# Patient Record
Sex: Female | Born: 1945 | Hispanic: No | Smoking: Never smoker
Health system: Southern US, Community
[De-identification: ages and names within clinical notes are randomized; demographics above are authoritative.]

## PROBLEM LIST (undated history)

## (undated) DIAGNOSIS — K759 Inflammatory liver disease, unspecified: Secondary | ICD-10-CM

## (undated) DIAGNOSIS — R040 Epistaxis: Secondary | ICD-10-CM

## (undated) DIAGNOSIS — I10 Essential (primary) hypertension: Secondary | ICD-10-CM

## (undated) DIAGNOSIS — M1990 Unspecified osteoarthritis, unspecified site: Secondary | ICD-10-CM

## (undated) HISTORY — PX: TOTAL KNEE ARTHROPLASTY: SHX125

## (undated) HISTORY — DX: Epistaxis: R04.0

## (undated) HISTORY — DX: Essential (primary) hypertension: I10

---

## 2013-09-28 ENCOUNTER — Ambulatory Visit (INDEPENDENT_AMBULATORY_CARE_PROVIDER_SITE_OTHER): Payer: Medicare Other

## 2013-09-28 VITALS — BP 146/93 | HR 88 | Resp 18

## 2013-09-28 DIAGNOSIS — G608 Other hereditary and idiopathic neuropathies: Secondary | ICD-10-CM

## 2013-09-28 DIAGNOSIS — G609 Hereditary and idiopathic neuropathy, unspecified: Secondary | ICD-10-CM

## 2013-09-28 DIAGNOSIS — G629 Polyneuropathy, unspecified: Principal | ICD-10-CM

## 2013-09-28 NOTE — Patient Instructions (Signed)

## 2013-09-28 NOTE — Progress Notes (Signed)
   Subjective:    Patient ID: Joy Palmer, female    DOB: [birth year], 68 y.o.   MRN: [id number]  HPI my feet are numb and have been that way for about 9 years ago and was in the right foot only and has gradually getting worse and left is not as bad and the shoes have helped    Review of Systems  HENT:       Ringing in ears  All other systems reviewed and are negative.      Objective:   Physical Exam 68 year old female presents this time well-developed well-nourished oriented x3 he continues to have complaints of over 9 years of abnormal sensation in the feet previously was in the right foot has gradually worsening extending from the foot to the ankle and lower leg this was previously seen did not have the same symptoms and cramping at times in the balls of the feet was previously wearing shoes that were too tight and narrow and possibly had suspect neuroma symptomology that portion has resolved no longer having the cramping sensation in the toes no longer tingling extending to the toes however patient continues to have numbness which is progressing from her toes to feet and lower leg. Patient had had some vascular testing at this time her lower extremity objective findings reveal palpable pedal pulses DP +2/4 PT one over 4 bilateral mild varicosities are noted skin temperature is warm to cool turgor somewhat diminished there is no edema rubor noted neurologically epicritic and proprioceptive sensations appear to be somewhat diminished on [facility] patient describes abnormal sensation or loss of sensation to her feet and ankles and lower leg left more so than right DTRs not elicited there is normal plantar response orthopedic exam unremarkable rectus foot type mild digital contractures no reducible pain or symptomology on compression of the interspaces no neuroma type symptomology cramping noted at this time.       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 Literature on neuropathy is given to the patient for her to review and recommendations times [facility] for workup of lower extremity peripheral neuropathy/sensory neuropathy patient seems somewhat reluctant about seeing a neurologist wanted to know if kinesiologist or chiropractor would be helpful however I suggest that without knowing a diagnosis starting a treatment program would be ill-advised patient was advised that she needs to have a thorough evaluation to discover the cause or aggravating factors causing the progressive nature of her sensory changes. [facility] carried out at this time  Joy Palmer DPM

## 2014-11-02 DIAGNOSIS — M1711 Unilateral primary osteoarthritis, right knee: Secondary | ICD-10-CM | POA: Diagnosis not present

## 2014-11-02 DIAGNOSIS — M25561 Pain in right knee: Secondary | ICD-10-CM | POA: Diagnosis not present

## 2015-02-16 DIAGNOSIS — Z01818 Encounter for other preprocedural examination: Secondary | ICD-10-CM | POA: Diagnosis not present

## 2015-02-21 ENCOUNTER — Other Ambulatory Visit: Payer: Self-pay | Admitting: Orthopedic Surgery

## 2015-03-16 DIAGNOSIS — M1712 Unilateral primary osteoarthritis, left knee: Secondary | ICD-10-CM | POA: Diagnosis not present

## 2015-03-19 ENCOUNTER — Encounter (HOSPITAL_COMMUNITY)
Admission: RE | Admit: 2015-03-19 | Discharge: 2015-03-19 | Disposition: A | Discharge status: Home / Self Care | Payer: Medicare Other | Source: Ambulatory Visit | Attending: Orthopedic Surgery | Admitting: Orthopedic Surgery

## 2015-03-19 ENCOUNTER — Encounter (HOSPITAL_COMMUNITY): Payer: Self-pay

## 2015-03-19 DIAGNOSIS — J984 Other disorders of lung: Secondary | ICD-10-CM | POA: Insufficient documentation

## 2015-03-19 DIAGNOSIS — M1711 Unilateral primary osteoarthritis, right knee: Secondary | ICD-10-CM | POA: Diagnosis not present

## 2015-03-19 DIAGNOSIS — Z01812 Encounter for preprocedural laboratory examination: Secondary | ICD-10-CM | POA: Insufficient documentation

## 2015-03-19 DIAGNOSIS — Z01818 Encounter for other preprocedural examination: Secondary | ICD-10-CM | POA: Diagnosis not present

## 2015-03-19 HISTORY — DX: Unspecified osteoarthritis, unspecified site: M19.90

## 2015-03-19 HISTORY — DX: Inflammatory liver disease, unspecified: K75.9

## 2015-03-19 LAB — PROTIME-INR
INR: 1.01 (ref 0.00–1.49)
PROTHROMBIN TIME: 13.5 s (ref 11.6–15.2)

## 2015-03-19 LAB — APTT: APTT: 35 s (ref 24–37)

## 2015-03-19 LAB — CBC WITH DIFFERENTIAL/PLATELET
BASOS ABS: 0.1 10*3/uL (ref 0.0–0.1)
Basophils Relative: 1 %
EOS ABS: 0.1 10*3/uL (ref 0.0–0.7)
EOS PCT: 2 %
HCT: 44.1 % (ref 36.0–46.0)
Hemoglobin: 13.9 g/dL (ref 12.0–15.0)
LYMPHS PCT: 18 %
Lymphs Abs: 1.6 10*3/uL (ref 0.7–4.0)
MCH: 29.8 pg (ref 26.0–34.0)
MCHC: 31.5 g/dL (ref 30.0–36.0)
MCV: 94.6 fL (ref 78.0–100.0)
Monocytes Absolute: 0.7 10*3/uL (ref 0.1–1.0)
Monocytes Relative: 8 %
Neutro Abs: 6.5 10*3/uL (ref 1.7–7.7)
Neutrophils Relative %: 71 %
Platelets: 178 10*3/uL (ref 150–400)
RBC: 4.66 MIL/uL (ref 3.87–5.11)
RDW: 15.9 % — ABNORMAL HIGH (ref 11.5–15.5)
WBC: 9 10*3/uL (ref 4.0–10.5)

## 2015-03-19 LAB — URINALYSIS, ROUTINE W REFLEX MICROSCOPIC
BILIRUBIN URINE: NEGATIVE
Glucose, UA: NEGATIVE mg/dL
Hgb urine dipstick: NEGATIVE
Ketones, ur: NEGATIVE mg/dL
Leukocytes, UA: NEGATIVE
NITRITE: NEGATIVE
PH: 6.5 (ref 5.0–8.0)
Protein, ur: NEGATIVE mg/dL
SPECIFIC GRAVITY, URINE: 1.021 (ref 1.005–1.030)
UROBILINOGEN UA: 1 mg/dL (ref 0.0–1.0)

## 2015-03-19 LAB — COMPREHENSIVE METABOLIC PANEL
ALBUMIN: 4.1 g/dL (ref 3.5–5.0)
ALT: 28 U/L (ref 14–54)
AST: 25 U/L (ref 15–41)
Alkaline Phosphatase: 67 U/L (ref 38–126)
Anion gap: 9 (ref 5–15)
BILIRUBIN TOTAL: 0.7 mg/dL (ref 0.3–1.2)
BUN: 26 mg/dL — AB (ref 6–20)
CHLORIDE: 103 mmol/L (ref 101–111)
CO2: 28 mmol/L (ref 22–32)
CREATININE: 0.78 mg/dL (ref 0.44–1.00)
Calcium: 9.6 mg/dL (ref 8.9–10.3)
GFR calc Af Amer: >60 mL/min (ref >60)
GLUCOSE: 87 mg/dL (ref 65–99)
POTASSIUM: 3.9 mmol/L (ref 3.5–5.1)
Sodium: 140 mmol/L (ref 135–145)
TOTAL PROTEIN: 6.8 g/dL (ref 6.5–8.1)

## 2015-03-19 LAB — SURGICAL PCR SCREEN
MRSA, PCR: NEGATIVE
STAPHYLOCOCCUS AUREUS: NEGATIVE

## 2015-03-19 NOTE — Pre-Procedure Instructions (Signed)
    Joy LootsMargaret S Palmer  03/19/2015      [facility] [place] [address] [place] [address] Phone: [phone] Fax: [phone]    Your procedure is scheduled on 03-25-2016  Monday  .  [facility] Admitting at 5:30 A.M.   Call this number if you have problems the morning of surgery:  [phone]   Remember:  Do not eat food or drink liquids after midnight.   Take these medicines the morning of surgery with A SIP OF WATER none   Do not wear jewelry, make-up or nail polish.   Do not wear lotions, powders, or perfumes.  You may not wear deodorant.  Do not shave 48 hours prior to surgery.     Do not bring valuables to the hospital.  [facility] is not responsible for any belongings or valuables.  Contacts, dentures or bridgework may not be worn into surgery.  Leave your suitcase in the car.  After surgery it may be brought to your room.  For patients admitted to the hospital, discharge time will be determined by your treatment team.     Special instructions:  See attached sheet "Preparing for surgery" for instructions on CHG shower  Please read over the following fact sheets that you were given. Pain Booklet, Coughing and Deep Breathing and Surgical Site Infection Prevention

## 2015-03-19 NOTE — Progress Notes (Signed)
[facility] and [facility] [place].  Pt. Denies any type of cardiac testing.

## 2015-03-20 LAB — URINE CULTURE

## 2015-03-23 MED ORDER — CHLORHEXIDINE GLUCONATE 4 % EX LIQD: 60.0000 mL | Freq: Once | CUTANEOUS | Status: DC

## 2015-03-23 MED ORDER — CEFAZOLIN SODIUM-DEXTROSE 2-3 GM-% IV SOLR
2.0000 g | INTRAVENOUS | Status: AC
  Administered 2015-03-26: 2 g via INTRAVENOUS
  Filled 2015-03-23: qty 50

## 2015-03-23 MED ORDER — SODIUM CHLORIDE 0.9 % IV SOLN
1000.0000 mg | INTRAVENOUS | Status: AC
  Administered 2015-03-26: 1000 mg via INTRAVENOUS
  Filled 2015-03-23: qty 10

## 2015-03-23 MED ORDER — BUPIVACAINE LIPOSOME 1.3 % IJ SUSP
20.0000 mL | Freq: Once | INTRAMUSCULAR | Status: AC
  Administered 2015-03-26: 20 mL
  Filled 2015-03-23: qty 20

## 2015-03-23 MED ORDER — SODIUM CHLORIDE 0.9 % IV SOLN: INTRAVENOUS | Status: DC

## 2015-03-26 ENCOUNTER — Inpatient Hospital Stay (HOSPITAL_COMMUNITY)
Admission: AD | Admit: 2015-03-26 | Discharge: 2015-03-27 | DRG: 470 | Disposition: A | Discharge status: Home Health | Payer: Medicare Other | Source: Ambulatory Visit | Attending: Orthopedic Surgery | Admitting: Orthopedic Surgery

## 2015-03-26 ENCOUNTER — Encounter (HOSPITAL_COMMUNITY): Payer: Self-pay | Admitting: *Deleted

## 2015-03-26 ENCOUNTER — Inpatient Hospital Stay (HOSPITAL_COMMUNITY): Payer: Medicare Other | Admitting: Certified Registered Nurse Anesthetist

## 2015-03-26 ENCOUNTER — Encounter (HOSPITAL_COMMUNITY)
Admission: AD | Disposition: A | Discharge status: Home Health | Payer: Self-pay | Source: Ambulatory Visit | Attending: Orthopedic Surgery

## 2015-03-26 DIAGNOSIS — D62 Acute posthemorrhagic anemia: Secondary | ICD-10-CM | POA: Diagnosis not present

## 2015-03-26 DIAGNOSIS — I10 Essential (primary) hypertension: Secondary | ICD-10-CM | POA: Diagnosis not present

## 2015-03-26 DIAGNOSIS — M1711 Unilateral primary osteoarthritis, right knee: Principal | ICD-10-CM | POA: Diagnosis present

## 2015-03-26 DIAGNOSIS — M179 Osteoarthritis of knee, unspecified: Secondary | ICD-10-CM | POA: Diagnosis not present

## 2015-03-26 DIAGNOSIS — M25561 Pain in right knee: Secondary | ICD-10-CM | POA: Diagnosis present

## 2015-03-26 DIAGNOSIS — Z96659 Presence of unspecified artificial knee joint: Secondary | ICD-10-CM

## 2015-03-26 HISTORY — PX: TOTAL KNEE ARTHROPLASTY: SHX125

## 2015-03-26 LAB — CBC
HEMATOCRIT: 42.4 % (ref 36.0–46.0)
HEMOGLOBIN: 12.9 g/dL (ref 12.0–15.0)
MCH: 29.1 pg (ref 26.0–34.0)
MCHC: 30.4 g/dL (ref 30.0–36.0)
MCV: 95.5 fL (ref 78.0–100.0)
Platelets: 187 10*3/uL (ref 150–400)
RBC: 4.44 MIL/uL (ref 3.87–5.11)
RDW: 16.1 % — AB (ref 11.5–15.5)
WBC: 20.6 10*3/uL — ABNORMAL HIGH (ref 4.0–10.5)

## 2015-03-26 LAB — CREATININE, SERUM
CREATININE: 0.88 mg/dL (ref 0.44–1.00)
GFR calc Af Amer: >60 mL/min (ref >60)
GFR calc non Af Amer: >60 mL/min (ref >60)

## 2015-03-26 SURGERY — ARTHROPLASTY, KNEE, TOTAL
Anesthesia: Spinal | Site: Knee | Laterality: Right

## 2015-03-26 MED ORDER — BISACODYL 5 MG PO TBEC: 5.0000 mg | DELAYED_RELEASE_TABLET | Freq: Every day | ORAL | Status: DC | PRN

## 2015-03-26 MED ORDER — BUPIVACAINE IN DEXTROSE 0.75-8.25 % IT SOLN
INTRATHECAL | Status: DC | PRN
  Administered 2015-03-26: 1.8 mL via INTRATHECAL

## 2015-03-26 MED ORDER — SODIUM CHLORIDE 0.9 % IJ SOLN
INTRAMUSCULAR | Status: DC | PRN
  Administered 2015-03-26: 20 mL

## 2015-03-26 MED ORDER — OXYCODONE HCL 5 MG PO TABS
5.0000 mg | ORAL_TABLET | Freq: Once | ORAL | Status: AC | PRN
  Administered 2015-03-26: 5 mg via ORAL

## 2015-03-26 MED ORDER — LISINOPRIL-HYDROCHLOROTHIAZIDE 20-12.5 MG PO TABS: 1.0000 | ORAL_TABLET | Freq: Every day | ORAL | Status: DC

## 2015-03-26 MED ORDER — METHOCARBAMOL 500 MG PO TABS
ORAL_TABLET | ORAL | Status: AC
  Filled 2015-03-26: qty 1

## 2015-03-26 MED ORDER — ONDANSETRON HCL 4 MG/2ML IJ SOLN
INTRAMUSCULAR | Status: AC
  Filled 2015-03-26: qty 2

## 2015-03-26 MED ORDER — ONDANSETRON HCL 4 MG/2ML IJ SOLN
INTRAMUSCULAR | Status: DC | PRN
  Administered 2015-03-26: 4 mg via INTRAVENOUS

## 2015-03-26 MED ORDER — SODIUM CHLORIDE 0.9 % IV SOLN
INTRAVENOUS | Status: DC
  Administered 2015-03-26: 20:00:00 via INTRAVENOUS

## 2015-03-26 MED ORDER — ACETAMINOPHEN 325 MG PO TABS: 650.0000 mg | ORAL_TABLET | Freq: Four times a day (QID) | ORAL | Status: DC | PRN

## 2015-03-26 MED ORDER — METOPROLOL TARTRATE 1 MG/ML IV SOLN
INTRAVENOUS | Status: AC
  Filled 2015-03-26: qty 5

## 2015-03-26 MED ORDER — HYDROCHLOROTHIAZIDE 25 MG PO TABS
25.0000 mg | ORAL_TABLET | Freq: Every day | ORAL | Status: DC
  Administered 2015-03-26 – 2015-03-27 (×2): 25 mg via ORAL
  Filled 2015-03-26 (×2): qty 1

## 2015-03-26 MED ORDER — ONDANSETRON HCL 4 MG PO TABS
4.0000 mg | ORAL_TABLET | Freq: Four times a day (QID) | ORAL | Status: DC | PRN
  Filled 2015-03-26: qty 1

## 2015-03-26 MED ORDER — BUPIVACAINE-EPINEPHRINE (PF) 0.25% -1:200000 IJ SOLN
INTRAMUSCULAR | Status: AC
  Filled 2015-03-26: qty 30

## 2015-03-26 MED ORDER — MENTHOL 3 MG MT LOZG: 1.0000 | LOZENGE | OROMUCOSAL | Status: DC | PRN

## 2015-03-26 MED ORDER — LISINOPRIL 20 MG PO TABS
20.0000 mg | ORAL_TABLET | Freq: Every day | ORAL | Status: DC
  Administered 2015-03-26 – 2015-03-27 (×2): 20 mg via ORAL
  Filled 2015-03-26 (×2): qty 1

## 2015-03-26 MED ORDER — OXYCODONE HCL 5 MG PO TABS
ORAL_TABLET | ORAL | Status: AC
  Filled 2015-03-26: qty 1

## 2015-03-26 MED ORDER — SENNOSIDES-DOCUSATE SODIUM 8.6-50 MG PO TABS: 1.0000 | ORAL_TABLET | Freq: Every evening | ORAL | Status: DC | PRN

## 2015-03-26 MED ORDER — ACETAMINOPHEN 650 MG RE SUPP: 650.0000 mg | Freq: Four times a day (QID) | RECTAL | Status: DC | PRN

## 2015-03-26 MED ORDER — HYDROMORPHONE HCL 1 MG/ML IJ SOLN
1.0000 mg | INTRAMUSCULAR | Status: DC | PRN
  Administered 2015-03-26 – 2015-03-27 (×3): 1 mg via INTRAVENOUS
  Filled 2015-03-26 (×3): qty 1

## 2015-03-26 MED ORDER — ALUM & MAG HYDROXIDE-SIMETH 200-200-20 MG/5ML PO SUSP: 30.0000 mL | ORAL | Status: DC | PRN

## 2015-03-26 MED ORDER — OXYCODONE HCL 5 MG PO TABS
5.0000 mg | ORAL_TABLET | ORAL | Status: DC | PRN
  Administered 2015-03-27 (×2): 10 mg via ORAL
  Filled 2015-03-26 (×2): qty 2

## 2015-03-26 MED ORDER — MIDAZOLAM HCL 2 MG/2ML IJ SOLN
INTRAMUSCULAR | Status: DC | PRN
  Administered 2015-03-26: 0.5 mg via INTRAVENOUS
  Administered 2015-03-26: 1.5 mg via INTRAVENOUS

## 2015-03-26 MED ORDER — PROPOFOL 10 MG/ML IV BOLUS
INTRAVENOUS | Status: DC | PRN
  Administered 2015-03-26 (×7): 25 mg via INTRAVENOUS

## 2015-03-26 MED ORDER — ENOXAPARIN SODIUM 30 MG/0.3ML ~~LOC~~ SOLN
30.0000 mg | Freq: Two times a day (BID) | SUBCUTANEOUS | Status: DC
  Administered 2015-03-27: 30 mg via SUBCUTANEOUS
  Filled 2015-03-26: qty 0.3

## 2015-03-26 MED ORDER — DIPHENHYDRAMINE HCL 12.5 MG/5ML PO ELIX: 12.5000 mg | ORAL_SOLUTION | ORAL | Status: DC | PRN

## 2015-03-26 MED ORDER — PROPOFOL 10 MG/ML IV BOLUS
INTRAVENOUS | Status: AC
  Filled 2015-03-26: qty 20

## 2015-03-26 MED ORDER — CEFAZOLIN SODIUM-DEXTROSE 2-3 GM-% IV SOLR
2.0000 g | Freq: Four times a day (QID) | INTRAVENOUS | Status: AC
  Administered 2015-03-26 – 2015-03-27 (×2): 2 g via INTRAVENOUS
  Filled 2015-03-26 (×3): qty 50

## 2015-03-26 MED ORDER — OXYCODONE HCL ER 10 MG PO T12A
10.0000 mg | EXTENDED_RELEASE_TABLET | Freq: Two times a day (BID) | ORAL | Status: DC
  Administered 2015-03-26 – 2015-03-27 (×3): 10 mg via ORAL
  Filled 2015-03-26 (×3): qty 1

## 2015-03-26 MED ORDER — METOCLOPRAMIDE HCL 5 MG/ML IJ SOLN: 5.0000 mg | Freq: Three times a day (TID) | INTRAMUSCULAR | Status: DC | PRN

## 2015-03-26 MED ORDER — PHENOL 1.4 % MT LIQD: 1.0000 | OROMUCOSAL | Status: DC | PRN

## 2015-03-26 MED ORDER — GLYCOPYRROLATE 0.2 MG/ML IJ SOLN
INTRAMUSCULAR | Status: AC
  Filled 2015-03-26: qty 1

## 2015-03-26 MED ORDER — METHOCARBAMOL 500 MG PO TABS
500.0000 mg | ORAL_TABLET | Freq: Four times a day (QID) | ORAL | Status: DC | PRN
  Administered 2015-03-26 – 2015-03-27 (×2): 500 mg via ORAL
  Filled 2015-03-26 (×2): qty 1

## 2015-03-26 MED ORDER — FENTANYL CITRATE (PF) 250 MCG/5ML IJ SOLN
INTRAMUSCULAR | Status: DC | PRN
  Administered 2015-03-26: 50 ug via INTRAVENOUS
  Administered 2015-03-26 (×2): 25 ug via INTRAVENOUS
  Administered 2015-03-26 (×3): 50 ug via INTRAVENOUS

## 2015-03-26 MED ORDER — METOPROLOL TARTRATE 1 MG/ML IV SOLN
INTRAVENOUS | Status: DC | PRN
  Administered 2015-03-26 (×3): 1 mg via INTRAVENOUS

## 2015-03-26 MED ORDER — METOCLOPRAMIDE HCL 5 MG PO TABS: 5.0000 mg | ORAL_TABLET | Freq: Three times a day (TID) | ORAL | Status: DC | PRN

## 2015-03-26 MED ORDER — OXYCODONE HCL 5 MG/5ML PO SOLN: 5.0000 mg | Freq: Once | ORAL | Status: AC | PRN

## 2015-03-26 MED ORDER — GLYCOPYRROLATE 0.2 MG/ML IJ SOLN
INTRAMUSCULAR | Status: DC | PRN
  Administered 2015-03-26 (×2): 0.1 mg via INTRAVENOUS

## 2015-03-26 MED ORDER — LIDOCAINE HCL (CARDIAC) 20 MG/ML IV SOLN
INTRAVENOUS | Status: DC | PRN
  Administered 2015-03-26: 60 mg via INTRAVENOUS

## 2015-03-26 MED ORDER — FENTANYL CITRATE (PF) 250 MCG/5ML IJ SOLN
INTRAMUSCULAR | Status: AC
  Filled 2015-03-26: qty 5

## 2015-03-26 MED ORDER — LACTATED RINGERS IV SOLN
INTRAVENOUS | Status: DC | PRN
  Administered 2015-03-26 (×2): via INTRAVENOUS

## 2015-03-26 MED ORDER — HYDROMORPHONE HCL 1 MG/ML IJ SOLN: 0.2500 mg | INTRAMUSCULAR | Status: DC | PRN

## 2015-03-26 MED ORDER — LIDOCAINE HCL (CARDIAC) 20 MG/ML IV SOLN
INTRAVENOUS | Status: AC
  Filled 2015-03-26: qty 5

## 2015-03-26 MED ORDER — ZOLPIDEM TARTRATE 5 MG PO TABS: 5.0000 mg | ORAL_TABLET | Freq: Every evening | ORAL | Status: DC | PRN

## 2015-03-26 MED ORDER — BUPIVACAINE-EPINEPHRINE (PF) 0.25% -1:200000 IJ SOLN
INTRAMUSCULAR | Status: DC | PRN
  Administered 2015-03-26: 30 mL

## 2015-03-26 MED ORDER — ACETAMINOPHEN 160 MG/5ML PO SOLN
325.0000 mg | ORAL | Status: DC | PRN
  Filled 2015-03-26: qty 20.3

## 2015-03-26 MED ORDER — METHOCARBAMOL 1000 MG/10ML IJ SOLN
500.0000 mg | Freq: Four times a day (QID) | INTRAVENOUS | Status: DC | PRN
  Filled 2015-03-26: qty 5

## 2015-03-26 MED ORDER — ONDANSETRON HCL 4 MG/2ML IJ SOLN: 4.0000 mg | Freq: Four times a day (QID) | INTRAMUSCULAR | Status: DC | PRN

## 2015-03-26 MED ORDER — FLEET ENEMA 7-19 GM/118ML RE ENEM: 1.0000 | ENEMA | Freq: Once | RECTAL | Status: DC | PRN

## 2015-03-26 MED ORDER — ACETAMINOPHEN 325 MG PO TABS: 325.0000 mg | ORAL_TABLET | ORAL | Status: DC | PRN

## 2015-03-26 MED ORDER — MIDAZOLAM HCL 2 MG/2ML IJ SOLN
INTRAMUSCULAR | Status: AC
  Filled 2015-03-26: qty 4

## 2015-03-26 MED ORDER — PROPOFOL 500 MG/50ML IV EMUL
INTRAVENOUS | Status: DC | PRN
  Administered 2015-03-26: 25 ug/kg/min via INTRAVENOUS

## 2015-03-26 MED ORDER — BUPIVACAINE-EPINEPHRINE (PF) 0.5% -1:200000 IJ SOLN
INTRAMUSCULAR | Status: AC
  Filled 2015-03-26: qty 30

## 2015-03-26 MED ORDER — DOCUSATE SODIUM 100 MG PO CAPS
100.0000 mg | ORAL_CAPSULE | Freq: Two times a day (BID) | ORAL | Status: DC
  Administered 2015-03-26 – 2015-03-27 (×3): 100 mg via ORAL
  Filled 2015-03-26 (×3): qty 1

## 2015-03-26 SURGICAL SUPPLY — 60 items
BANDAGE ESMARK 6X9 LF (GAUZE/BANDAGES/DRESSINGS) ×1 IMPLANT
BLADE SAGITTAL 13X1.27X60 (BLADE) ×2 IMPLANT
BLADE SAW SGTL 83.5X18.5 (BLADE) ×2 IMPLANT
BLADE SURG 10 STRL SS (BLADE) ×2 IMPLANT
BNDG CMPR 9X6 STRL LF SNTH (GAUZE/BANDAGES/DRESSINGS) ×1
BNDG ESMARK 6X9 LF (GAUZE/BANDAGES/DRESSINGS) ×2
BOWL SMART MIX CTS (DISPOSABLE) ×2 IMPLANT
CAPT KNEE TOTAL 3 ×2 IMPLANT
CEMENT BONE SIMPLEX SPEEDSET (Cement) ×4 IMPLANT
COVER SURGICAL LIGHT HANDLE (MISCELLANEOUS) ×2 IMPLANT
CUFF TOURNIQUET SINGLE 34IN LL (TOURNIQUET CUFF) ×2 IMPLANT
DRAPE EXTREMITY T 121X128X90 (DRAPE) ×2 IMPLANT
DRAPE INCISE IOBAN 66X45 STRL (DRAPES) ×4 IMPLANT
DRAPE PROXIMA HALF (DRAPES) IMPLANT
DRAPE U-SHAPE 47X51 STRL (DRAPES) ×2 IMPLANT
DRSG ADAPTIC 3X8 NADH LF (GAUZE/BANDAGES/DRESSINGS) ×2 IMPLANT
DRSG PAD ABDOMINAL 8X10 ST (GAUZE/BANDAGES/DRESSINGS) ×2 IMPLANT
DURAPREP 26ML APPLICATOR (WOUND CARE) ×4 IMPLANT
ELECT REM PT RETURN 9FT ADLT (ELECTROSURGICAL) ×2
ELECTRODE REM PT RTRN 9FT ADLT (ELECTROSURGICAL) ×1 IMPLANT
GAUZE SPONGE 4X4 12PLY STRL (GAUZE/BANDAGES/DRESSINGS) ×2 IMPLANT
GLOVE BIO SURGEON STRL SZ 6.5 (GLOVE) ×1 IMPLANT
GLOVE BIOGEL M 7.0 STRL (GLOVE) IMPLANT
GLOVE BIOGEL PI IND STRL 6.5 (GLOVE) IMPLANT
GLOVE BIOGEL PI IND STRL 7.5 (GLOVE) IMPLANT
GLOVE BIOGEL PI IND STRL 8.5 (GLOVE) ×2 IMPLANT
GLOVE BIOGEL PI INDICATOR 6.5 (GLOVE) ×1
GLOVE BIOGEL PI INDICATOR 7.5 (GLOVE)
GLOVE BIOGEL PI INDICATOR 8.5 (GLOVE) ×2
GLOVE SURG ORTHO 8.0 STRL STRW (GLOVE) ×4 IMPLANT
GOWN STRL REUS W/ TWL LRG LVL3 (GOWN DISPOSABLE) ×1 IMPLANT
GOWN STRL REUS W/ TWL XL LVL3 (GOWN DISPOSABLE) ×2 IMPLANT
GOWN STRL REUS W/TWL LRG LVL3 (GOWN DISPOSABLE) ×2
GOWN STRL REUS W/TWL XL LVL3 (GOWN DISPOSABLE) ×4
HANDPIECE INTERPULSE COAX TIP (DISPOSABLE) ×2
HOOD PEEL AWAY FACE SHEILD DIS (HOOD) ×6 IMPLANT
KIT BASIN OR (CUSTOM PROCEDURE TRAY) ×2 IMPLANT
KIT ROOM TURNOVER OR (KITS) ×2 IMPLANT
KNEE CAPITATED TOTAL 3 IMPLANT
MANIFOLD NEPTUNE II (INSTRUMENTS) ×2 IMPLANT
NEEDLE 22X1 1/2 (OR ONLY) (NEEDLE) ×4 IMPLANT
NS IRRIG 1000ML POUR BTL (IV SOLUTION) ×2 IMPLANT
PACK TOTAL JOINT (CUSTOM PROCEDURE TRAY) ×2 IMPLANT
PACK UNIVERSAL I (CUSTOM PROCEDURE TRAY) ×2 IMPLANT
PAD ARMBOARD 7.5X6 YLW CONV (MISCELLANEOUS) ×4 IMPLANT
PADDING CAST COTTON 6X4 STRL (CAST SUPPLIES) ×2 IMPLANT
SET HNDPC FAN SPRY TIP SCT (DISPOSABLE) ×1 IMPLANT
SPONGE GAUZE 4X4 12PLY STER LF (GAUZE/BANDAGES/DRESSINGS) ×1 IMPLANT
STAPLER VISISTAT 35W (STAPLE) ×2 IMPLANT
SUCTION FRAZIER TIP 10 FR DISP (SUCTIONS) ×2 IMPLANT
SUT BONE WAX W31G (SUTURE) ×2 IMPLANT
SUT VIC AB 0 CTB1 27 (SUTURE) ×4 IMPLANT
SUT VIC AB 1 CT1 27 (SUTURE) ×4
SUT VIC AB 1 CT1 27XBRD ANBCTR (SUTURE) ×2 IMPLANT
SUT VIC AB 2-0 CT1 27 (SUTURE) ×4
SUT VIC AB 2-0 CT1 TAPERPNT 27 (SUTURE) ×2 IMPLANT
SYR 20CC LL (SYRINGE) ×4 IMPLANT
TOWEL OR 17X24 6PK STRL BLUE (TOWEL DISPOSABLE) ×2 IMPLANT
TOWEL OR 17X26 10 PK STRL BLUE (TOWEL DISPOSABLE) ×2 IMPLANT
WATER STERILE IRR 1000ML POUR (IV SOLUTION) ×4 IMPLANT

## 2015-03-26 NOTE — Progress Notes (Signed)
Orthopedic Tech Progress Note Patient Details:  Joy LootsMargaret S Shuffler 1945/09/26 [id number]  CPM Right Knee CPM Right Knee: On Right Knee Flexion (Degrees): 90 Right Knee Extension (Degrees): 0 Additional Comments: Trapeze bar and foot roll   Saul FordyceJennifer C Sylvia Kondracki 03/26/2015, 10:42 AM

## 2015-03-26 NOTE — Anesthesia Preprocedure Evaluation (Signed)
Anesthesia Evaluation  Patient identified by MRN, date of birth, ID band Patient awake    Reviewed: Allergy & Precautions, NPO status , Patient's Chart, lab work & pertinent test results  History of Anesthesia Complications Negative for: history of anesthetic complications  Airway Mallampati: II  TM Distance: >3 FB Neck ROM: Full    Dental  (+) Teeth Intact   Pulmonary neg pulmonary ROS,    breath sounds clear to auscultation       Cardiovascular hypertension, Pt. on medications  Rhythm:Regular     Neuro/Psych negative neurological ROS  negative psych ROS   GI/Hepatic negative GI ROS, Neg liver ROS,   Endo/Other  negative endocrine ROS  Renal/GU negative Renal ROS     Musculoskeletal  (+) Arthritis ,   Abdominal   Peds  Hematology negative hematology ROS (+)   Anesthesia Other Findings   Reproductive/Obstetrics                             Anesthesia Physical Anesthesia Plan  ASA: II  Anesthesia Plan: Spinal   Post-op Pain Management:    Induction: Intravenous  Airway Management Planned: Nasal Cannula  Additional Equipment: None  Intra-op Plan:   Post-operative Plan:   Informed Consent: I have reviewed the patients History and Physical, chart, labs and discussed the procedure including the risks, benefits and alternatives for the proposed anesthesia with the patient or authorized representative who has indicated his/her understanding and acceptance.   Dental advisory given  Plan Discussed with: CRNA and Surgeon  Anesthesia Plan Comments:         Anesthesia Quick Evaluation

## 2015-03-26 NOTE — Transfer of Care (Signed)
Immediate Anesthesia Transfer of Care Note  Patient: Joy LootsMargaret S Rottmann  Procedure(s) Performed: Procedure(s): RIGHT TOTAL KNEE ARTHROPLASTY (Right)  Patient Location: PACU  Anesthesia Type:Regional  Level of Consciousness: awake, alert  and oriented  Airway & Oxygen Therapy: Patient Spontanous Breathing and Patient connected to nasal cannula oxygen  Post-op Assessment: Report given to RN and Post -op Vital signs reviewed and stable  Post vital signs: Reviewed and stable  Last Vitals:  Filed Vitals:   03/26/15 0623  BP: 186/90  Pulse: 89  Temp: 36.4 C  Resp: 18    Complications: No apparent anesthesia complications

## 2015-03-26 NOTE — Progress Notes (Signed)
Utilization review completed.  

## 2015-03-26 NOTE — H&P (Signed)
  Joy LootsMargaret S Strome MRN:  [id number] DOB/SEX:  05-09-1946/female  CHIEF COMPLAINT:  Painful right Knee  HISTORY: Patient is a 69 y.o. female presented with a history of pain in the right knee. Onset of symptoms was gradual starting several years ago with gradually worsening course since that time. Prior procedures on the knee include arthroscopy. Patient has been treated conservatively with over-the-counter NSAIDs and activity modification. Patient currently rates pain in the knee at 10 out of 10 with activity. There is pain at night.  PAST MEDICAL HISTORY: There are no active problems to display for this patient.  Past Medical History  Diagnosis Date  . Hypertension   . Bleeding nose   . Arthritis   . 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 No past surgeries       MEDICATIONS:   Prescriptions prior to admission  Medication Sig Dispense Refill Last Dose  . lisinopril-hydrochlorothiazide (PRINZIDE,ZESTORETIC) 20-12.5 MG per tablet Take 1 tablet by mouth daily.   03/25/2015 at Unknown time    ALLERGIES:  No Known Allergies  REVIEW OF SYSTEMS:  Pertinent items noted in HPI and remainder of comprehensive ROS otherwise negative.   FAMILY HISTORY:  History reviewed. No pertinent family history.  SOCIAL HISTORY:   Social History  Substance Use Topics  . Smoking status: Never Smoker   . Smokeless tobacco: Never Used  . Alcohol Use: No     EXAMINATION:  Vital signs in last 24 hours: Temp:  [97.6 F (36.4 C)] 97.6 F (36.4 C) (10/31 0623) Pulse Rate:  [89] 89 (10/31 0623) Resp:  [18] 18 (10/31 0623) BP: (186)/(90) 186/90 mmHg (10/31 0623) SpO2:  [99 %] 99 % (10/31 0623) Weight:  [93.441 kg (206 lb)] 93.441 kg (206 lb) (10/31 0622)  General appearance: alert, cooperative and no distress Lungs: clear to auscultation bilaterally Heart: regular rate and rhythm, S1, S2 normal, no murmur, click, rub or gallop Abdomen: soft,  non-tender; bowel sounds normal; no masses,  no organomegaly Extremities: extremities normal, atraumatic, no cyanosis or edema and Homans sign is negative, no sign of DVT Pulses: 2+ and symmetric Skin: Skin color, texture, turgor normal. No rashes or lesions Neurologic: Alert and oriented X 3, normal strength and tone. Normal symmetric reflexes. Normal coordination and gait  Musculoskeletal:  ROM 0-110, Ligaments intact,  Imaging Review Plain radiographs demonstrate severe degenerative joint disease of the right knee. The overall alignment is significant varus. The bone quality appears to be good for age and reported activity level.  Assessment/Plan: Primary osteoarthritis, right knee   The patient history, physical examination and imaging studies are consistent with advanced degenerative joint disease of the right knee. The patient has failed conservative treatment.  The clearance notes were reviewed.  After discussion with the patient it was felt that Total Knee Replacement was indicated. The procedure,  risks, and benefits of total knee arthroplasty were presented and reviewed. The risks including but not limited to aseptic loosening, infection, blood clots, vascular injury, stiffness, patella tracking problems complications among others were discussed. The patient acknowledged the explanation, agreed to proceed with the plan.  Constancio,Weiland Tomich 03/26/2015, 6:26 AM

## 2015-03-26 NOTE — Anesthesia Procedure Notes (Addendum)
Spinal Patient location during procedure: OR Staffing Anesthesiologist: Jareth Pardee Preanesthetic Checklist Completed: patient identified, surgical consent, pre-op evaluation, timeout performed, IV checked, risks and benefits discussed and monitors and equipment checked Spinal Block Patient position: sitting Prep: site prepped and draped and DuraPrep Patient monitoring: heart rate, cardiac monitor, continuous pulse ox and blood pressure Approach: midline Location: L3-4 Injection technique: single-shot Needle Needle type: Pencan  Needle gauge: 24 G Needle length: 10 cm Assessment Sensory level: T6   

## 2015-03-26 NOTE — Evaluation (Signed)
Physical Therapy Evaluation Patient Details Name: Joy Palmer MRN: [id number] DOB: 1946/05/20 Today's Date: 03/26/2015   History of Present Illness  Patient is a 69 y/o female s/p Rt TKA. PMH includes HTN, hepatitis.  Clinical Impression  Patient presents with pain, lethargy and post surgical deficits RLE s/p Rt TKA. Tolerated taking a few steps to the chair with Min A for balance/safety. Upon arrival into room, pt with blanket under right knee in flexed position. Educated pt on knee precautions and importance of positioning. Instructed pt in exercises. Pt will have support from family at d/c for a few days. Will follow acutely to maximize independence and mobility prior to return home.     Follow Up Recommendations Home health PT;Supervision/Assistance - 24 hour    Equipment Recommendations  None recommended by PT    Recommendations for Other Services OT consult     Precautions / Restrictions Precautions Precautions: Knee;Fall Precaution Booklet Issued: No Precaution Comments: Reviewed no pillow under knee and precautions. Restrictions Weight Bearing Restrictions: Yes RLE Weight Bearing: Weight bearing as tolerated      Mobility  Bed Mobility Overal bed mobility: Needs Assistance Bed Mobility: Supine to Sit     Supine to sit: Supervision;HOB elevated     General bed mobility comments: Increased time. + dizziness.  Transfers Overall transfer level: Needs assistance Equipment used: Rolling walker (2 wheeled) Transfers: Sit to/from Stand Sit to Stand: Min assist         General transfer comment: Min A to boost from EOB with cues for hand placement/technique. Unsteady.   Ambulation/Gait Ambulation/Gait assistance: Min assist Ambulation Distance (Feet): 5 Feet Assistive device: Rolling walker (2 wheeled) Gait Pattern/deviations: Step-to pattern;Decreased stance time - right;Decreased step length - left;Trunk flexed   Gait velocity interpretation: Below  normal speed for age/gender General Gait Details: Able to take a few steps to chair with Min A for balance. Cues to extend RLE as pt with increased knee flexion throughout gait.  Stairs            Wheelchair Mobility    Modified Rankin (Stroke Patients Only)       Balance Overall balance assessment: Needs assistance Sitting-balance support: Feet supported;No upper extremity supported Sitting balance-Leahy Scale: Fair     Standing balance support: During functional activity Standing balance-Leahy Scale: Poor Standing balance comment: Relient on RW and Min A for support.                              Pertinent Vitals/Pain Pain Assessment: 0-10 Pain Score: 4  Pain Location: right knee Pain Descriptors / Indicators: Sore Pain Intervention(s): Monitored during session;Repositioned;Limited activity within patient's tolerance    Home Living Family/patient expects to be discharged to:: Private residence Living Arrangements: Spouse/significant other Available Help at Discharge: Family;Available PRN/intermittently (Spouse and son will be there for a few days.) Type of Home: House Home Access: Stairs to enter Entrance Stairs-Rails: Right Entrance Stairs-Number of Steps: 3 Home Layout: Two level;Able to live on main level with bedroom/bathroom Home Equipment: Dan Humphreys - 2 wheels      Prior Function Level of Independence: Independent         Comments: Mows her lawn.     Hand Dominance        Extremity/Trunk Assessment   Upper Extremity Assessment: Defer to OT evaluation           Lower Extremity Assessment: RLE deficits/detail RLE Deficits / Details: Limited AROM/strength  secondary to pain and surgery. Able to perform QS. Pt with increased knee flexion upon PT arrival with blanket under knee.       Communication   Communication: No difficulties  Cognition Arousal/Alertness: Lethargic;Suspect due to medications Behavior During Therapy: [facility] for  tasks assessed/performed Overall Cognitive Status: Difficult to assess                      General Comments General comments (skin integrity, edema, etc.): Spouse and son present in room during session.    Exercises Total Joint Exercises Ankle Circles/Pumps: Both;15 reps;Supine Quad Sets: Both;Supine;10 reps;AROM Gluteal Sets: Both;10 reps;Seated      Assessment/Plan    PT Assessment Patient needs continued PT services  PT Diagnosis Difficulty walking;Generalized weakness;Acute pain   PT Problem List Decreased strength;Pain;Decreased range of motion;Impaired sensation;Decreased activity tolerance;Decreased balance;Decreased mobility;Decreased knowledge of precautions  PT Treatment Interventions Balance training;Gait training;Functional mobility training;Therapeutic activities;Therapeutic exercise;Patient/family education;Stair training   PT Goals (Current goals can be found in the Care Plan section) Acute Rehab PT Goals Patient Stated Goal: to return to independence PT Goal Formulation: With patient Time For Goal Achievement: 04/09/15 Potential to Achieve Goals: Good    Frequency 7X/week   Barriers to discharge   3 steps to climb to get into home; spouse and son will be able to assist for a few days at d/c.    Co-evaluation               End of Session Equipment Utilized During Treatment: Gait belt Activity Tolerance: Patient limited by lethargy Patient left: in chair;with call bell/phone within reach;with family/visitor present Nurse Communication: Mobility status         Time: 1311-1330 PT Time Calculation (min) (ACUTE ONLY): 19 min   Charges:   PT Evaluation $Initial PT Evaluation Tier I: 1 Procedure     PT G Codes:        Ellijah Leffel A Adelaido Nicklaus 03/26/2015, 1:39 PM Mylo RedShauna Ayo Guarino, PT, DPT [phone]

## 2015-03-27 ENCOUNTER — Encounter (HOSPITAL_COMMUNITY): Payer: Self-pay | Admitting: Orthopedic Surgery

## 2015-03-27 LAB — BASIC METABOLIC PANEL
ANION GAP: 8 (ref 5–15)
BUN: 20 mg/dL (ref 6–20)
CALCIUM: 8.6 mg/dL — AB (ref 8.9–10.3)
CHLORIDE: 102 mmol/L (ref 101–111)
CO2: 26 mmol/L (ref 22–32)
Creatinine, Ser: 0.75 mg/dL (ref 0.44–1.00)
Glucose, Bld: 159 mg/dL — ABNORMAL HIGH (ref 65–99)
POTASSIUM: 3.2 mmol/L — AB (ref 3.5–5.1)
Sodium: 136 mmol/L (ref 135–145)

## 2015-03-27 LAB — CBC
HEMATOCRIT: 37.3 % (ref 36.0–46.0)
HEMOGLOBIN: 11.3 g/dL — AB (ref 12.0–15.0)
MCH: 28.6 pg (ref 26.0–34.0)
MCHC: 30.3 g/dL (ref 30.0–36.0)
MCV: 94.4 fL (ref 78.0–100.0)
Platelets: 152 10*3/uL (ref 150–400)
RBC: 3.95 MIL/uL (ref 3.87–5.11)
RDW: 16.4 % — ABNORMAL HIGH (ref 11.5–15.5)
WBC: 13.9 10*3/uL — AB (ref 4.0–10.5)

## 2015-03-27 MED ORDER — OXYCODONE HCL 10 MG PO TABS: 10.0000 mg | ORAL_TABLET | Freq: Two times a day (BID) | ORAL | Status: DC

## 2015-03-27 MED ORDER — ENOXAPARIN SODIUM 40 MG/0.4ML ~~LOC~~ SOLN: 40.0000 mg | SUBCUTANEOUS | Status: DC

## 2015-03-27 MED ORDER — TIZANIDINE HCL 2 MG PO TABS: 2.0000 mg | ORAL_TABLET | Freq: Four times a day (QID) | ORAL | Status: DC | PRN

## 2015-03-27 MED ORDER — OXYCODONE HCL 5 MG PO TABS: 5.0000 mg | ORAL_TABLET | ORAL | Status: DC | PRN

## 2015-03-27 NOTE — Progress Notes (Signed)
Physical therapy Progress Note: Pt with increased gait distance today with educate for HEP and handout provided. Pt and family educated for positioning with foam roll as well as use of CPM. Pt states she is to D/C after session and will defer CPM til she gets home.    03/27/15 1300  PT Visit Information  Last PT Received On 03/27/15  Assistance Needed +1  History of Present Illness Patient is a 69 y/o female s/p Rt TKA. PMH includes HTN, hepatitis.  PT Time Calculation  PT Start Time (ACUTE ONLY) 1241  PT Stop Time (ACUTE ONLY) 1317  PT Time Calculation (min) (ACUTE ONLY) 36 min  Precautions  Precautions Knee;Fall  Precaution Comments No pillow under knee reviewed  Restrictions  Weight Bearing Restrictions Yes  RLE Weight Bearing WBAT  Pain Assessment  Pain Assessment 0-10  Pain Score 8  Pain Location right knee with standing  Pain Descriptors / Indicators Throbbing  Pain Intervention(s) Limited activity within patient's tolerance;Monitored during session;Premedicated before session;Repositioned  Cognition  Arousal/Alertness Awake/alert  Behavior During Therapy WFL for tasks assessed/performed  Overall Cognitive Status Within Functional Limits for tasks assessed  Bed Mobility  Overal bed mobility Needs Assistance  Bed Mobility Supine to Sit  Supine to sit Min guard  General bed mobility comments cues for sequence to assist RLE with LLE  Transfers  Overall transfer level Needs assistance  Transfers Sit to/from Stand  Sit to Stand Min guard  General transfer comment Min guard for safety. VC for hand plaement and technique. Sit to stand from chair x 1  Ambulation/Gait  Ambulation/Gait assistance Min guard  Ambulation Distance (Feet) 100 Feet  Assistive device Rolling walker (2 wheeled)  Gait Pattern/deviations Step-to pattern  General Gait Details cues for sequence, posture, position in RW.   Gait velocity interpretation Below normal speed for age/gender  Exercises   Exercises Total Joint  Total Joint Exercises  Ankle Circles/Pumps AROM;Right;10 reps  Quad Sets AROM;Right;10 reps;Seated  Hip ABduction/ADduction AROM;Right;15 reps;Seated  Long Arc [place]ad AAROM;Right;15 reps;Seated  PT - End of Session  Activity Tolerance Patient tolerated treatment well  Patient left in chair;with call bell/phone within reach;with family/visitor present  Nurse Communication Mobility status  PT - Assessment/Plan  PT Plan Current plan remains appropriate  Follow Up Recommendations Home health PT  PT Goal Progression  Progress towards PT goals Progressing toward goals  PT General Charges  $$ ACUTE PT VISIT 1 Procedure  PT Treatments  $Gait Training 8-22 mins  $Therapeutic Exercise 8-22 mins  Delaney MeigsMaija Tabor Kaysee Hergert, PT [phone]

## 2015-03-27 NOTE — Discharge Instructions (Signed)

## 2015-03-27 NOTE — Progress Notes (Signed)
Physical Therapy Treatment Patient Details Name: Joy Palmer MRN: [id number] DOB: 1946-01-15 Today's Date: 03/27/2015    History of Present Illness Patient is a 69 y/o female s/p Rt TKA. PMH includes HTN, hepatitis.    PT Comments    Pt with increased gait today limited by fatigue. Very limited knee ROM with education for need to utilize foam roll for extension, HEP and CPM. Pt verbalized understanding. Will continue to follow to maximize mobility and function.   Follow Up Recommendations  Home health PT;Supervision/Assistance - 24 hour     Equipment Recommendations       Recommendations for Other Services       Precautions / Restrictions Precautions Precautions: Knee;Fall Precaution Comments: Reviewed no pillow under knee and precautions. Restrictions RLE Weight Bearing: Weight bearing as tolerated    Mobility  Bed Mobility Overal bed mobility: Needs Assistance       Supine to sit: Supervision;HOB elevated     General bed mobility comments: sequential cues with use of LLE to move RLE off of bed  Transfers     Transfers: Sit to/from Stand Sit to Stand: Min guard         General transfer comment: cues for hand and RLE placement with transfers  Ambulation/Gait Ambulation/Gait assistance: Min guard Ambulation Distance (Feet): 35 Feet Assistive device: Rolling walker (2 wheeled) Gait Pattern/deviations: Step-to pattern;Trunk flexed;Decreased stride length;Decreased stance time - right   Gait velocity interpretation: Below normal speed for age/gender General Gait Details: cues for sequence, posture, position in RW. Pt walked 35' then 3150' after seated rest toileting   Stairs            Wheelchair Mobility    Modified Rankin (Stroke Patients Only)       Balance Overall balance assessment: Needs assistance   Sitting balance-Leahy Scale: Good       Standing balance-Leahy Scale: Poor                      Cognition  Arousal/Alertness: Awake/alert Behavior During Therapy: WFL for tasks assessed/performed Overall Cognitive Status: Within Functional Limits for tasks assessed                      Exercises Total Joint Exercises Quad Sets: AROM;Right;10 reps;Supine Short Arc Quad: AROM;Seated;Right;10 reps Knee Flexion: AAROM;Supine;Right;10 reps Goniometric ROM: 15-55    General Comments        Pertinent Vitals/Pain Pain Score: 4  Pain Location: right knee Pain Descriptors / Indicators: Sore Pain Intervention(s): Limited activity within patient's tolerance;Premedicated before session;Repositioned    Home Living                      Prior Function            PT Goals (current goals can now be found in the care plan section) Progress towards PT goals: Progressing toward goals    Frequency       PT Plan Current plan remains appropriate    Co-evaluation             End of Session   Activity Tolerance: Patient tolerated treatment well Patient left: in chair;with call bell/phone within reach;with family/visitor present;Other (comment) (foam roll under RLE)     Time: [id number] PT Time Calculation (min) (ACUTE ONLY): 32 min  Charges:  $Gait Training: 8-22 mins $Therapeutic Exercise: 8-22 mins  G CodesToney Sang Beth 04/02/15, 10:18 AM Delaney Meigs, PT [phone]

## 2015-03-27 NOTE — Care Management Note (Signed)
Case Management Note  Patient Details  Name: Jeraldine LootsMargaret S Carignan MRN: [id number] Date of Birth: Nov 12, 1945  Subjective/Objective:    69 yr old female s/p right total knee arthroplasty.              Action/Plan: Case manager spoke with patient concerning home health and DME needs at discharge. Patient was preoperatively setup with [facility], no changes. Patient has DME.   Expected Discharge Date:   03/28/15               Expected Discharge Plan:   Home with Home Health  In-House Referral:  NA  Discharge planning Services  CM Consult  Post Acute Care Choice:    Choice offered to:  Patient  DME Arranged:    DME Agency:  NA  HH Arranged:  PT HH Agency:  [facility]  Status of Service:  Completed, signed off  Medicare Important Message Given:    Date Medicare IM Given:    Medicare IM give by:    Date Additional Medicare IM Given:    Additional Medicare Important Message give by:     If discussed at Long Length of Stay Meetings, dates discussed:    Additional Comments:  Durenda GuthrieBrady, [place] Kyler Naomi, RN 03/27/2015, 3:07 PM

## 2015-03-27 NOTE — Op Note (Signed)
TOTAL KNEE REPLACEMENT OPERATIVE NOTE:  03/26/2015  1:59 PM  PATIENT:  Joy Palmer  69 y.o. female  PRE-OPERATIVE DIAGNOSIS:  primary osteoarthritis right knee  POST-OPERATIVE DIAGNOSIS:  primary osteoarthritis right knee  PROCEDURE:  Procedure(s): RIGHT TOTAL KNEE ARTHROPLASTY  SURGEON:  Surgeon(s): Dannielle Huh, MD  PHYSICIAN ASSISTANT: Altamese Cabal, [facility]  ANESTHESIA:   spinal  DRAINS: Hemovac  SPECIMEN: None  COUNTS:  Correct  TOURNIQUET:   Total Tourniquet Time Documented: Thigh (Right) - 49 minutes Total: Thigh (Right) - 49 minutes   DICTATION:  Indication for procedure:    The patient is a 69 y.o. female who has failed conservative treatment for primary osteoarthritis right knee.  Informed consent was obtained prior to anesthesia. The risks versus benefits of the operation were explain and in a way the patient can, and did, understand.   On the implant demand matching protocol, this patient scored 8.  Therefore, this patient did" "did not receive a polyethylene insert with vitamin E which is a high demand implant.  Description of procedure:     The patient was taken to the operating room and placed under anesthesia.  The patient was positioned in the usual fashion taking care that all body parts were adequately padded and/or protected.  I foley catheter was not placed.  A tourniquet was applied and the leg prepped and draped in the usual sterile fashion.  The extremity was exsanguinated with the esmarch and tourniquet inflated to 350 mmHg.  Pre-operative range of motion was normal.  The knee was in 8 degree of mild varus.  A midline incision approximately 6-7 inches long was made with a #10 blade.  A new blade was used to make a parapatellar arthrotomy going 2-3 cm into the quadriceps tendon, over the patella, and alongside the medial aspect of the patellar tendon.  A synovectomy was then performed with the #10 blade and forceps. I then elevated the deep MCL  off the medial tibial metaphysis subperiosteally around to the semimembranosus attachment.    I everted the patella and used calipers to measure patellar thickness.  I used the reamer to ream down to appropriate thickness to recreate the native thickness.  I then removed excess bone with the rongeur and sagittal saw.  I used the appropriately sized template and drilled the three lug holes.  I then put the trial in place and measured the thickness with the calipers to ensure recreation of the native thickness.  The trial was then removed and the patella subluxed and the knee brought into flexion.  A homan retractor was place to retract and protect the patella and lateral structures.  A Z-retractor was place medially to protect the medial structures.  The extra-medullary alignment system was used to make cut the tibial articular surface perpendicular to the anamotic axis of the tibia and in 3 degrees of posterior slope.  The cut surface and alignment jig was removed.  I then used the intramedullary alignment guide to make a 6 valgus cut on the distal femur.  I then marked out the epicondylar axis on the distal femur.  The posterior condylar axis measured 3 degrees.  I then used the anterior referencing sizer and measured the femur to be a size 8.  The 4-In-1 cutting block was screwed into place in external rotation matching the posterior condylar angle, making our cuts perpendicular to the epicondylar axis.  Anterior, posterior and chamfer cuts were made with the sagittal saw.  The cutting block and cut  pieces were removed.  A lamina spreader was placed in 90 degrees of flexion.  The ACL, PCL, menisci, and posterior condylar osteophytes were removed.  A 10 mm spacer blocked was found to offer good flexion and extension gap balance after minimal in degree releasing.   The scoop retractor was then placed and the femoral finishing block was pinned in place.  The small sagittal saw was used as well as the lug  drill to finish the femur.  The block and cut surfaces were removed and the medullary canal hole filled with autograft bone from the cut pieces.  The tibia was delivered forward in deep flexion and external rotation.  A size E tray was selected and pinned into place centered on the medial 1/3 of the tibial tubercle.  The reamer and keel was used to prepare the tibia through the tray.    I then trialed with the size 8 femur, size E tibia, a 10 mm insert and the 32 patella.  I had excellent flexion/extension gap balance, excellent patella tracking.  Flexion was full and beyond 120 degrees; extension was zero.  These components were chosen and the staff opened them to me on the back table while the knee was lavaged copiously and the cement mixed.  The soft tissue was infiltrated with 60cc of exparel 1.3% through a 21 gauge needle.  I cemented in the components and removed all excess cement.  The polyethylene tibial component was snapped into place and the knee placed in extension while cement was hardening.  The capsule was infilltrated with 30cc of .25% Marcaine with epinephrine.  A hemovac was place in the joint exiting superolaterally.  A pain pump was place superomedially superficial to the arthrotomy.  Once the cement was hard, the tourniquet was let down.  Hemostasis was obtained.  The arthrotomy was closed with figure-8 #1 vicryl sutures.  The deep soft tissues were closed with #0 vicryls and the subcuticular layer closed with a running #2-0 vicryl.  The skin was reapproximated and closed with skin staples.  The wound was dressed with xeroform, 4 x4's, 2 ABD sponges, a single layer of webril and a TED stocking.   The patient was then awakened, extubated, and taken to the recovery room in stable condition.  BLOOD LOSS:  300cc DRAINS: 1 hemovac, 1 pain catheter COMPLICATIONS:  None.  PLAN OF CARE: Admit to inpatient   PATIENT DISPOSITION:  PACU - hemodynamically stable.   Delay start of  Pharmacological VTE agent (>24hrs) due to surgical blood loss or risk of bleeding:  not applicable  Please fax a copy of this op note to my office at [phone] (please only include page 1 and 2 of the Case Information op note)

## 2015-03-27 NOTE — Progress Notes (Signed)
SPORTS MEDICINE AND JOINT REPLACEMENT  Joy SpurlingStephen Lucey, MD   Altamese CabalMaurice Adeyemi, PA-C [address], [place], [place]  [address]                             [phone]   PROGRESS NOTE  Subjective:  negative for Chest Pain  negative for Shortness of Breath  negative for Nausea/Vomiting   negative for Calf Pain  negative for Bowel Movement   Tolerating Diet: yes         Patient reports pain as 4 on 0-10 scale.    Objective: Vital signs in last 24 hours:   Patient Vitals for the past 24 hrs:  BP Temp Temp src Pulse Resp SpO2  03/27/15 0446 134/63 mmHg 99 F (37.2 C) Oral 93 17 95 %  03/27/15 0101 (!) 147/65 mmHg 98.8 F (37.1 C) Oral 90 18 95 %  03/26/15 1957 131/65 mmHg 99.7 F (37.6 C) Oral 81 18 95 %    @flow {1959:LAST@   Intake/Output from previous day:   10/31 0701 - 11/01 0700 In: 2157.5 [I.V.:2107.5] Out: 5    Intake/Output this shift:       Intake/Output      10/31 0701 - 11/01 0700 11/01 0701 - 11/02 0700   I.V. (mL/kg) 2107.5 (22.6)    IV Piggyback 50    Total Intake(mL/kg) 2157.5 (23.1)    Blood 5    Total Output 5     Net +2152.5             LABORATORY DATA:  Recent Labs  03/26/15 1510 03/27/15 0522  WBC 20.6* 13.9*  HGB 12.9 11.3*  HCT 42.4 37.3  PLT 187 152    Recent Labs  03/26/15 1510 03/27/15 0522  NA  --  136  K  --  3.2*  CL  --  102  CO2  --  26  BUN  --  20  CREATININE 0.88 0.75  GLUCOSE  --  159*  CALCIUM  --  8.6*   Lab Results  Component Value Date   INR 1.01 03/19/2015    Examination:  General appearance: alert, cooperative and no distress Extremities: Homans sign is negative, no sign of DVT  Wound Exam: clean, dry, intact   Drainage:  None: wound tissue dry  Motor Exam: EHL and FHL Intact  Sensory Exam: Deep Peroneal normal   Assessment:    1 Day Post-Op  Procedure(s) (LRB): RIGHT TOTAL KNEE ARTHROPLASTY (Right)  ADDITIONAL DIAGNOSIS:  Active Problems:   S/P total knee arthroplasty  Acute  Blood Loss Anemia   Plan: Physical Therapy as ordered Weight Bearing as Tolerated (WBAT)  DVT Prophylaxis:  Lovenox  DISCHARGE PLAN: Home  DISCHARGE NEEDS: HHPT, CPM, Walker and 3-in-1 comode seat         Schriver,[place] 03/27/2015, 12:58 PM

## 2015-03-27 NOTE — Evaluation (Signed)
Occupational Therapy Evaluation Patient Details Name: Joy Palmer MRN: [id number] DOB: [birth year] Today's Date: 03/27/2015    History of Present Illness Patient is a 69 y/o female s/p Rt TKA. PMH includes HTN, hepatitis.   Clinical Impression   Pt reports she was independent with ADLs and mobility PTA. Currently pt is overall min guard for ADLs and mobility with the exception of min A for LB ADLs. Educated pt on compensatory strategies for LB ADLs, edema management techniques, home safety, use of 3 in 1; pt verbalized understanding. Pt plan to d/c home with 24/7 supervision from family for a few days then intermittent supervision. Recommending follow up of HHOT to increase independence and safety with ADLs and functional mobility. Pt would benefit from continued OT services in order to maximize safety and independence with tub transfers using a 3 in 1.     Follow Up Recommendations  Home health OT;Supervision/Assistance - 24 hour    Equipment Recommendations  3 in 1 bedside comode    Recommendations for Other Services       Precautions / Restrictions Precautions Precautions: Knee;Fall Precaution Comments: No pillow under knee reviewed Restrictions Weight Bearing Restrictions: Yes RLE Weight Bearing: Weight bearing as tolerated      Mobility Bed Mobility Overal bed mobility: Needs Assistance Bed Mobility: Sit to Supine     Supine to sit: Supervision;HOB elevated Sit to supine: Supervision   General bed mobility comments: Supervision for safety.  Transfers Overall transfer level: Needs assistance Equipment used: Rolling walker (2 wheeled) Transfers: Sit to/from Stand Sit to Stand: Min guard         General transfer comment: Min guard for safety. VC for hand plaement and technique. Sit to stand from chair x 1    Balance Overall balance assessment: Needs assistance Sitting-balance support: No upper extremity supported;Feet supported Sitting balance-Leahy  Scale: Good     Standing balance support: Bilateral upper extremity supported Standing balance-Leahy Scale: Poor Standing balance comment: RW for support                            ADL Overall ADL's : Needs assistance/impaired Eating/Feeding: Set up;Sitting   Grooming: Min guard;Standing       Lower Body Bathing: Minimal assistance;Sit to/from stand       Lower Body Dressing: Minimal assistance;Sit to/from stand   Toilet Transfer: Min guard;Ambulation;BSC;RW (BSC over toilet)   Toileting- Clothing Manipulation and Hygiene: Min guard;Sit to/from stand       Functional mobility during ADLs: Min guard;Rolling walker General ADL Comments: Husband present for OT session. Educated pt on compensatory strategies for LB ADLs, edema management techniques, use of 3 in 1, home safety; pt verbalized understanding.       Vision     Perception     Praxis      Pertinent Vitals/Pain Pain Assessment: 0-10 Pain Score: 7  Pain Location: R knee Pain Descriptors / Indicators: Aching;Burning;Sore Pain Intervention(s): Limited activity within patient's tolerance;Monitored during session;Repositioned;Ice applied     Hand Dominance     Extremity/Trunk Assessment Upper Extremity Assessment Upper Extremity Assessment: Overall WFL for tasks assessed   Lower Extremity Assessment Lower Extremity Assessment: Defer to PT evaluation       Communication Communication Communication: No difficulties   Cognition Arousal/Alertness: Awake/alert Behavior During Therapy: WFL for tasks assessed/performed Overall Cognitive Status: Within Functional Limits for tasks assessed  General Comments       Exercises       Shoulder Instructions      Home Living Family/patient expects to be discharged to:: Private residence Living Arrangements: Spouse/significant other Available Help at Discharge: Family;Available PRN/intermittently (spouse and son will be  there for a few days) Type of Home: House Home Access: Stairs to enter Entergy CorporationEntrance Stairs-Number of Steps: 3 Entrance Stairs-Rails: Right Home Layout: Two level;Able to live on main level with bedroom/bathroom Alternate Level Stairs-Number of Steps: 1 flight Alternate Level Stairs-Rails: Right Bathroom Shower/Tub: Chief Strategy OfficerTub/shower unit   Bathroom Toilet: Standard Bathroom Accessibility: Yes How Accessible: Accessible via walker Home Equipment: Walker - 2 wheels          Prior Functioning/Environment Level of Independence: Independent        Comments: Likes to garden     OT Diagnosis: Generalized weakness;Acute pain   OT Problem List: Decreased activity tolerance;Impaired balance (sitting and/or standing);Decreased safety awareness;Decreased knowledge of use of DME or AE;Decreased knowledge of precautions;Pain   OT Treatment/Interventions: Self-care/ADL training;DME and/or AE instruction;Patient/family education    OT Goals(Current goals can be found in the care plan section) Acute Rehab OT Goals Patient Stated Goal: to return to independence OT Goal Formulation: With patient Time For Goal Achievement: 04/10/15 Potential to Achieve Goals: Good ADL Goals Pt Will Perform Tub/Shower Transfer: Tub transfer;ambulating;with supervision;3 in 1;rolling walker  OT Frequency: Min 2X/week   Barriers to D/C:            Co-evaluation              End of Session Equipment Utilized During Treatment: Gait belt;Rolling walker CPM Right Knee CPM Right Knee: Off Nurse Communication: Other (comment) (pt requesting CPM)  Activity Tolerance: Patient tolerated treatment well Patient left: in bed;with call bell/phone within reach;with family/visitor present (zero degree bone foam applied)   Time: 1047-1106 OT Time Calculation (min): 19 min Charges:  OT General Charges $OT Visit: 1 Procedure OT Evaluation $Initial OT Evaluation Tier I: 1 Procedure G-Codes:     Gaye AlkenBailey A Sidni Fusco M.S.,  OTR/L Pager: [phone]: [phone]  03/27/2015, 11:18 AM

## 2015-03-28 DIAGNOSIS — Z79891 Long term (current) use of opiate analgesic: Secondary | ICD-10-CM | POA: Diagnosis not present

## 2015-03-28 DIAGNOSIS — I10 Essential (primary) hypertension: Secondary | ICD-10-CM | POA: Diagnosis not present

## 2015-03-28 DIAGNOSIS — Z96651 Presence of right artificial knee joint: Secondary | ICD-10-CM | POA: Diagnosis not present

## 2015-03-28 DIAGNOSIS — M1711 Unilateral primary osteoarthritis, right knee: Secondary | ICD-10-CM | POA: Diagnosis not present

## 2015-03-28 DIAGNOSIS — Z471 Aftercare following joint replacement surgery: Secondary | ICD-10-CM | POA: Diagnosis not present

## 2015-03-28 DIAGNOSIS — Z8619 Personal history of other infectious and parasitic diseases: Secondary | ICD-10-CM | POA: Diagnosis not present

## 2015-03-28 DIAGNOSIS — Z9181 History of falling: Secondary | ICD-10-CM | POA: Diagnosis not present

## 2015-03-28 DIAGNOSIS — M1990 Unspecified osteoarthritis, unspecified site: Secondary | ICD-10-CM | POA: Diagnosis not present

## 2015-03-28 NOTE — Anesthesia Postprocedure Evaluation (Signed)
  Anesthesia Post-op Note  Patient: Joy Palmer  Procedure(s) Performed: Procedure(s): RIGHT TOTAL KNEE ARTHROPLASTY (Right)  Patient Location: PACU  Anesthesia Type:Spinal  Level of Consciousness: awake  Airway and Oxygen Therapy: Patient Spontanous Breathing  Post-op Pain: mild  Post-op Assessment: Post-op Vital signs reviewed, Patient's Cardiovascular Status Stable, Respiratory Function Stable, Patent Airway, No signs of Nausea or vomiting and Pain level controlled LLE Motor Response: Responds to commands, Purposeful movement LLE Sensation: Full sensation RLE Motor Response: Responds to commands RLE Sensation: Pain, Full sensation L Sensory Level: L5-Outer lower leg, top of foot, great toe R Sensory Level: L4-Anterior knee, lower leg  Post-op Vital Signs: Reviewed and stable  Last Vitals:  Filed Vitals:   03/27/15 0446  BP: 134/63  Pulse: 93  Temp: 37.2 C  Resp: 17    Complications: No apparent anesthesia complications

## 2015-03-29 DIAGNOSIS — Z79891 Long term (current) use of opiate analgesic: Secondary | ICD-10-CM | POA: Diagnosis not present

## 2015-03-29 DIAGNOSIS — Z8619 Personal history of other infectious and parasitic diseases: Secondary | ICD-10-CM | POA: Diagnosis not present

## 2015-03-29 DIAGNOSIS — Z96651 Presence of right artificial knee joint: Secondary | ICD-10-CM | POA: Diagnosis not present

## 2015-03-29 DIAGNOSIS — I10 Essential (primary) hypertension: Secondary | ICD-10-CM | POA: Diagnosis not present

## 2015-03-29 DIAGNOSIS — Z9181 History of falling: Secondary | ICD-10-CM | POA: Diagnosis not present

## 2015-03-29 DIAGNOSIS — M1990 Unspecified osteoarthritis, unspecified site: Secondary | ICD-10-CM | POA: Diagnosis not present

## 2015-03-29 DIAGNOSIS — Z471 Aftercare following joint replacement surgery: Secondary | ICD-10-CM | POA: Diagnosis not present

## 2015-03-30 DIAGNOSIS — M1990 Unspecified osteoarthritis, unspecified site: Secondary | ICD-10-CM | POA: Diagnosis not present

## 2015-03-30 DIAGNOSIS — Z8619 Personal history of other infectious and parasitic diseases: Secondary | ICD-10-CM | POA: Diagnosis not present

## 2015-03-30 DIAGNOSIS — Z79891 Long term (current) use of opiate analgesic: Secondary | ICD-10-CM | POA: Diagnosis not present

## 2015-03-30 DIAGNOSIS — Z96651 Presence of right artificial knee joint: Secondary | ICD-10-CM | POA: Diagnosis not present

## 2015-03-30 DIAGNOSIS — Z471 Aftercare following joint replacement surgery: Secondary | ICD-10-CM | POA: Diagnosis not present

## 2015-03-30 DIAGNOSIS — I10 Essential (primary) hypertension: Secondary | ICD-10-CM | POA: Diagnosis not present

## 2015-03-30 DIAGNOSIS — Z9181 History of falling: Secondary | ICD-10-CM | POA: Diagnosis not present

## 2015-03-31 DIAGNOSIS — Z9181 History of falling: Secondary | ICD-10-CM | POA: Diagnosis not present

## 2015-03-31 DIAGNOSIS — Z79891 Long term (current) use of opiate analgesic: Secondary | ICD-10-CM | POA: Diagnosis not present

## 2015-03-31 DIAGNOSIS — M1990 Unspecified osteoarthritis, unspecified site: Secondary | ICD-10-CM | POA: Diagnosis not present

## 2015-03-31 DIAGNOSIS — I10 Essential (primary) hypertension: Secondary | ICD-10-CM | POA: Diagnosis not present

## 2015-03-31 DIAGNOSIS — Z8619 Personal history of other infectious and parasitic diseases: Secondary | ICD-10-CM | POA: Diagnosis not present

## 2015-03-31 DIAGNOSIS — Z471 Aftercare following joint replacement surgery: Secondary | ICD-10-CM | POA: Diagnosis not present

## 2015-03-31 DIAGNOSIS — Z96651 Presence of right artificial knee joint: Secondary | ICD-10-CM | POA: Diagnosis not present

## 2015-04-02 DIAGNOSIS — Z471 Aftercare following joint replacement surgery: Secondary | ICD-10-CM | POA: Diagnosis not present

## 2015-04-02 DIAGNOSIS — Z8619 Personal history of other infectious and parasitic diseases: Secondary | ICD-10-CM | POA: Diagnosis not present

## 2015-04-02 DIAGNOSIS — Z9181 History of falling: Secondary | ICD-10-CM | POA: Diagnosis not present

## 2015-04-02 DIAGNOSIS — Z96651 Presence of right artificial knee joint: Secondary | ICD-10-CM | POA: Diagnosis not present

## 2015-04-02 DIAGNOSIS — I10 Essential (primary) hypertension: Secondary | ICD-10-CM | POA: Diagnosis not present

## 2015-04-02 DIAGNOSIS — M1990 Unspecified osteoarthritis, unspecified site: Secondary | ICD-10-CM | POA: Diagnosis not present

## 2015-04-02 DIAGNOSIS — Z79891 Long term (current) use of opiate analgesic: Secondary | ICD-10-CM | POA: Diagnosis not present

## 2015-04-04 DIAGNOSIS — I10 Essential (primary) hypertension: Secondary | ICD-10-CM | POA: Diagnosis not present

## 2015-04-04 DIAGNOSIS — Z8619 Personal history of other infectious and parasitic diseases: Secondary | ICD-10-CM | POA: Diagnosis not present

## 2015-04-04 DIAGNOSIS — M1990 Unspecified osteoarthritis, unspecified site: Secondary | ICD-10-CM | POA: Diagnosis not present

## 2015-04-04 DIAGNOSIS — Z471 Aftercare following joint replacement surgery: Secondary | ICD-10-CM | POA: Diagnosis not present

## 2015-04-04 DIAGNOSIS — Z9181 History of falling: Secondary | ICD-10-CM | POA: Diagnosis not present

## 2015-04-04 DIAGNOSIS — Z96651 Presence of right artificial knee joint: Secondary | ICD-10-CM | POA: Diagnosis not present

## 2015-04-04 DIAGNOSIS — Z79891 Long term (current) use of opiate analgesic: Secondary | ICD-10-CM | POA: Diagnosis not present

## 2015-04-06 DIAGNOSIS — Z471 Aftercare following joint replacement surgery: Secondary | ICD-10-CM | POA: Diagnosis not present

## 2015-04-06 DIAGNOSIS — Z79891 Long term (current) use of opiate analgesic: Secondary | ICD-10-CM | POA: Diagnosis not present

## 2015-04-06 DIAGNOSIS — I10 Essential (primary) hypertension: Secondary | ICD-10-CM | POA: Diagnosis not present

## 2015-04-06 DIAGNOSIS — M1990 Unspecified osteoarthritis, unspecified site: Secondary | ICD-10-CM | POA: Diagnosis not present

## 2015-04-06 DIAGNOSIS — Z9181 History of falling: Secondary | ICD-10-CM | POA: Diagnosis not present

## 2015-04-06 DIAGNOSIS — Z96651 Presence of right artificial knee joint: Secondary | ICD-10-CM | POA: Diagnosis not present

## 2015-04-06 DIAGNOSIS — Z8619 Personal history of other infectious and parasitic diseases: Secondary | ICD-10-CM | POA: Diagnosis not present

## 2015-04-06 NOTE — Discharge Summary (Signed)
SPORTS MEDICINE & JOINT REPLACEMENT   Georgena Spurling, MD   Altamese Cabal, PA-C [address] [place], [place], [place]  [address]                             [phone]  PATIENT ID: Joy Palmer        MRN:  [id number]          DOB/AGE: 1945/12/04 / 69 y.o.    DISCHARGE SUMMARY  ADMISSION DATE:    03/26/2015 DISCHARGE DATE:   03/27/2015  ADMISSION DIAGNOSIS: primary osteoarthritis right knee    DISCHARGE DIAGNOSIS:  primary osteoarthritis right knee    ADDITIONAL DIAGNOSIS: Active Problems:   S/P total knee arthroplasty  Past Medical History  Diagnosis Date  . Hypertension   . Bleeding nose   . Arthritis   . 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 admitted on 03/26/2015 and found to have a diagnosis of primary osteoarthritis right knee.  After appropriate laboratory studies were obtained  they were taken to the operating room on 03/26/2015 and underwent Procedure(s): RIGHT TOTAL KNEE ARTHROPLASTY.   They were given perioperative antibiotics:  Anti-infectives    Start     Dose/Rate Route Frequency Ordered Stop   03/26/15 1400  ceFAZolin (ANCEF) IVPB 2 g/50 mL premix     2 g 100 mL/hr over 30 Minutes Intravenous Every 6 hours 03/26/15 1143 03/27/15 0203   03/26/15 0700  ceFAZolin (ANCEF) IVPB 2 g/50 mL premix     2 g 100 mL/hr over 30 Minutes Intravenous To ShortStay Surgical 03/23/15 1334 03/26/15 0737    .  Tolerated the procedure well.  Placed with a foley intraoperatively.  Given Ofirmev at induction and for 48 hours.    POD# 1: Vital signs were stable.  Patient denied Chest pain, shortness of breath, or calf pain.  Patient was started on Lovenox 30 mg subcutaneously twice daily at 8am.  Consults to PT, OT, and care management were made.  The patient was weight bearing as tolerated.  CPM was placed on  the operative leg 0-90 degrees for 6-8 hours a day.  Incentive spirometry was taught.  Dressing was changed.  Hemovac was discontinued.      POD #2, Continued  PT for ambulation and exercise program.  IV saline locked.  O2 discontinued.    The remainder of the hospital course was dedicated to ambulation and strengthening.   The patient was discharged on day 1 post op in  Good condition.  Blood products given:none  DIAGNOSTIC STUDIES: Recent vital signs: No data found.      Recent laboratory studies: No results for input(s): WBC, HGB, HCT, PLT in the last 168 hours. No results for input(s): NA, K, CL, CO2, BUN, CREATININE, GLUCOSE, CALCIUM in the last 168 hours. Lab Results  Component Value Date   INR 1.01 03/19/2015     Recent Radiographic Studies :  Dg Chest 2 View  03/19/2015  CLINICAL DATA:  Preop Right total knee arthroplasty EXAM: CHEST  2 VIEW COMPARISON:  None. FINDINGS: Normal heart size. No pleural effusion or edema identified. No airspace consolidation. Scar like densities noted in both lung bases. IMPRESSION: 1. Bibasilar scarring. 2. No acute findings noted. Electronically  Signed   By: Signa Kell M.D.   On: 03/19/2015 11:02    DISCHARGE INSTRUCTIONS: Discharge Instructions    CPM    Complete by:  As directed   Continuous passive motion machine (CPM):      Use the CPM from 0 to 90 for 6-8 hours per day.      You may increase by 10 per day.  You may break it up into 2 or 3 sessions per day.      Use CPM for 2 weeks or until you are told to stop.     Call MD / Call 911    Complete by:  As directed   If you experience chest pain or shortness of breath, CALL 911 and be transported to the hospital emergency room.  If you develope a fever above 101 F, pus (white drainage) or increased drainage or redness at the wound, or calf pain, call your surgeon's office.     Change dressing    Complete by:  As directed   Change dressing on wednesday, then change the dressing  daily with sterile 4 x 4 inch gauze dressing and apply TED hose.     Constipation Prevention    Complete by:  As directed   Drink plenty of fluids.  Prune juice may be helpful.  You may use a stool softener, such as Colace (over the counter) 100 mg twice a day.  Use MiraLax (over the counter) for constipation as needed.     Diet - low sodium heart healthy    Complete by:  As directed      Do not put a pillow under the knee. Place it under the heel.    Complete by:  As directed      Driving restrictions    Complete by:  As directed   No driving for 6 weeks     Increase activity slowly as tolerated    Complete by:  As directed      Lifting restrictions    Complete by:  As directed   No lifting for 6 weeks     TED hose    Complete by:  As directed   Use stockings (TED hose) for 2 weeks on both leg(s).  You may remove them at night for sleeping.           DISCHARGE MEDICATIONS:     Medication List    TAKE these medications        enoxaparin 40 MG/0.4ML injection  Commonly known as:  LOVENOX  Inject 0.4 mLs (40 mg total) into the skin daily.     lisinopril-hydrochlorothiazide 20-12.5 MG tablet  Commonly known as:  PRINZIDE,ZESTORETIC  Take 1 tablet by mouth daily.     oxyCODONE 5 MG immediate release tablet  Commonly known as:  Oxy IR/ROXICODONE  Take 1-2 tablets (5-10 mg total) by mouth every 3 (three) hours as needed for breakthrough pain.     Oxycodone HCl 10 MG Tabs  Take 1 tablet (10 mg total) by mouth 2 (two) times daily.     tiZANidine 2 MG tablet  Commonly known as:  ZANAFLEX  Take 1 tablet (2 mg total) by mouth every 6 (six) hours as needed.        FOLLOW UP VISIT:       Follow-up Information    Follow up with Raymon Mutton, MD. Call on 04/10/2015.   Specialty:  Orthopedic Surgery   Contact information:   [address]  [place] [address] [phone]       Follow up with [facility].   Why:  Someone from [facility]  will contact you concerning start date and time for therapy.   Contact information:   [address] [place] [address] [phone]       DISPOSITION: HOME   CONDITION:  Good   Joy Palmer,Joy Palmer 04/06/2015, 5:03 PM

## 2015-04-09 DIAGNOSIS — Z96651 Presence of right artificial knee joint: Secondary | ICD-10-CM | POA: Diagnosis not present

## 2015-04-09 DIAGNOSIS — M1990 Unspecified osteoarthritis, unspecified site: Secondary | ICD-10-CM | POA: Diagnosis not present

## 2015-04-09 DIAGNOSIS — Z79891 Long term (current) use of opiate analgesic: Secondary | ICD-10-CM | POA: Diagnosis not present

## 2015-04-09 DIAGNOSIS — Z8619 Personal history of other infectious and parasitic diseases: Secondary | ICD-10-CM | POA: Diagnosis not present

## 2015-04-09 DIAGNOSIS — Z471 Aftercare following joint replacement surgery: Secondary | ICD-10-CM | POA: Diagnosis not present

## 2015-04-09 DIAGNOSIS — Z9181 History of falling: Secondary | ICD-10-CM | POA: Diagnosis not present

## 2015-04-09 DIAGNOSIS — I10 Essential (primary) hypertension: Secondary | ICD-10-CM | POA: Diagnosis not present

## 2015-04-10 DIAGNOSIS — Z96651 Presence of right artificial knee joint: Secondary | ICD-10-CM | POA: Diagnosis not present

## 2015-04-10 DIAGNOSIS — Z471 Aftercare following joint replacement surgery: Secondary | ICD-10-CM | POA: Diagnosis not present

## 2015-04-11 DIAGNOSIS — R262 Difficulty in walking, not elsewhere classified: Secondary | ICD-10-CM | POA: Diagnosis not present

## 2015-04-11 DIAGNOSIS — M25561 Pain in right knee: Secondary | ICD-10-CM | POA: Diagnosis not present

## 2015-04-13 DIAGNOSIS — R262 Difficulty in walking, not elsewhere classified: Secondary | ICD-10-CM | POA: Diagnosis not present

## 2015-04-13 DIAGNOSIS — M25561 Pain in right knee: Secondary | ICD-10-CM | POA: Diagnosis not present

## 2015-04-16 DIAGNOSIS — R262 Difficulty in walking, not elsewhere classified: Secondary | ICD-10-CM | POA: Diagnosis not present

## 2015-04-16 DIAGNOSIS — M25561 Pain in right knee: Secondary | ICD-10-CM | POA: Diagnosis not present

## 2015-04-18 DIAGNOSIS — R262 Difficulty in walking, not elsewhere classified: Secondary | ICD-10-CM | POA: Diagnosis not present

## 2015-04-18 DIAGNOSIS — M25561 Pain in right knee: Secondary | ICD-10-CM | POA: Diagnosis not present

## 2015-04-23 DIAGNOSIS — R262 Difficulty in walking, not elsewhere classified: Secondary | ICD-10-CM | POA: Diagnosis not present

## 2015-04-23 DIAGNOSIS — M25561 Pain in right knee: Secondary | ICD-10-CM | POA: Diagnosis not present

## 2015-04-25 DIAGNOSIS — R262 Difficulty in walking, not elsewhere classified: Secondary | ICD-10-CM | POA: Diagnosis not present

## 2015-04-25 DIAGNOSIS — M25561 Pain in right knee: Secondary | ICD-10-CM | POA: Diagnosis not present

## 2015-04-27 DIAGNOSIS — M25561 Pain in right knee: Secondary | ICD-10-CM | POA: Diagnosis not present

## 2015-04-27 DIAGNOSIS — R262 Difficulty in walking, not elsewhere classified: Secondary | ICD-10-CM | POA: Diagnosis not present

## 2015-04-30 DIAGNOSIS — R262 Difficulty in walking, not elsewhere classified: Secondary | ICD-10-CM | POA: Diagnosis not present

## 2015-04-30 DIAGNOSIS — M25561 Pain in right knee: Secondary | ICD-10-CM | POA: Diagnosis not present

## 2015-05-02 DIAGNOSIS — R262 Difficulty in walking, not elsewhere classified: Secondary | ICD-10-CM | POA: Diagnosis not present

## 2015-05-02 DIAGNOSIS — M25561 Pain in right knee: Secondary | ICD-10-CM | POA: Diagnosis not present

## 2015-05-04 DIAGNOSIS — R262 Difficulty in walking, not elsewhere classified: Secondary | ICD-10-CM | POA: Diagnosis not present

## 2015-05-04 DIAGNOSIS — M25561 Pain in right knee: Secondary | ICD-10-CM | POA: Diagnosis not present

## 2015-05-07 DIAGNOSIS — R262 Difficulty in walking, not elsewhere classified: Secondary | ICD-10-CM | POA: Diagnosis not present

## 2015-05-07 DIAGNOSIS — M25561 Pain in right knee: Secondary | ICD-10-CM | POA: Diagnosis not present

## 2015-05-09 DIAGNOSIS — R262 Difficulty in walking, not elsewhere classified: Secondary | ICD-10-CM | POA: Diagnosis not present

## 2015-05-09 DIAGNOSIS — M25561 Pain in right knee: Secondary | ICD-10-CM | POA: Diagnosis not present

## 2015-05-10 DIAGNOSIS — M5136 Other intervertebral disc degeneration, lumbar region: Secondary | ICD-10-CM | POA: Diagnosis not present

## 2015-05-10 DIAGNOSIS — M4316 Spondylolisthesis, lumbar region: Secondary | ICD-10-CM | POA: Diagnosis not present

## 2015-05-10 DIAGNOSIS — M4696 Unspecified inflammatory spondylopathy, lumbar region: Secondary | ICD-10-CM | POA: Diagnosis not present

## 2015-05-10 DIAGNOSIS — Z96651 Presence of right artificial knee joint: Secondary | ICD-10-CM | POA: Diagnosis not present

## 2015-05-14 DIAGNOSIS — R262 Difficulty in walking, not elsewhere classified: Secondary | ICD-10-CM | POA: Diagnosis not present

## 2015-05-14 DIAGNOSIS — M25561 Pain in right knee: Secondary | ICD-10-CM | POA: Diagnosis not present

## 2015-05-16 DIAGNOSIS — M25561 Pain in right knee: Secondary | ICD-10-CM | POA: Diagnosis not present

## 2015-05-16 DIAGNOSIS — R262 Difficulty in walking, not elsewhere classified: Secondary | ICD-10-CM | POA: Diagnosis not present

## 2015-05-25 DIAGNOSIS — R262 Difficulty in walking, not elsewhere classified: Secondary | ICD-10-CM | POA: Diagnosis not present

## 2015-05-25 DIAGNOSIS — M25561 Pain in right knee: Secondary | ICD-10-CM | POA: Diagnosis not present

## 2016-12-03 DIAGNOSIS — Z1211 Encounter for screening for malignant neoplasm of colon: Secondary | ICD-10-CM | POA: Diagnosis not present

## 2017-03-31 DIAGNOSIS — M19011 Primary osteoarthritis, right shoulder: Secondary | ICD-10-CM | POA: Diagnosis not present

## 2017-04-12 IMAGING — CR DG CHEST 2V
2 series · 2 of 2 positions shown · non-contrast
Comparison: None.

CLINICAL DATA: Preop Right total knee arthroplasty

EXAM:
CHEST  2 VIEW

[w chest pa]
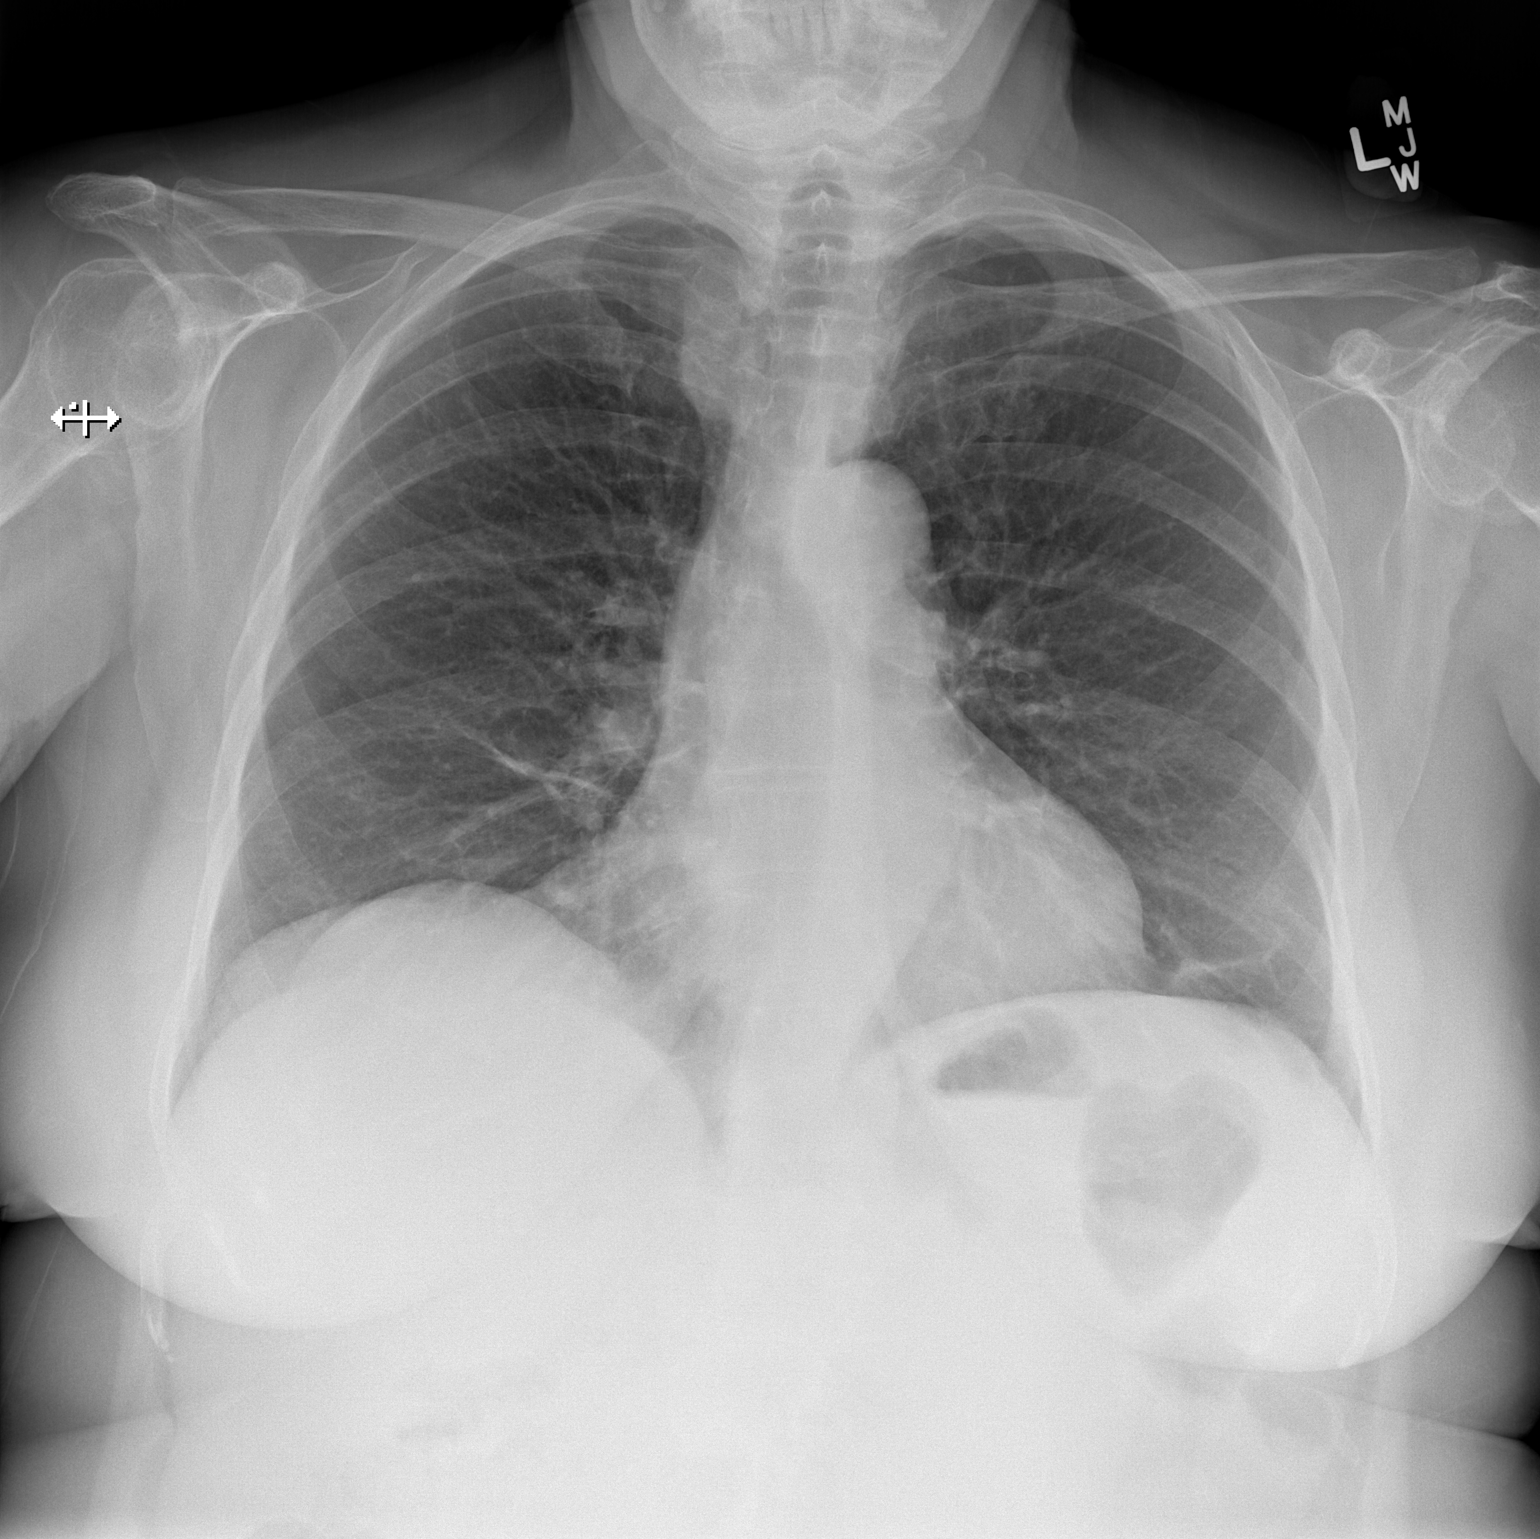

[w chest lat]
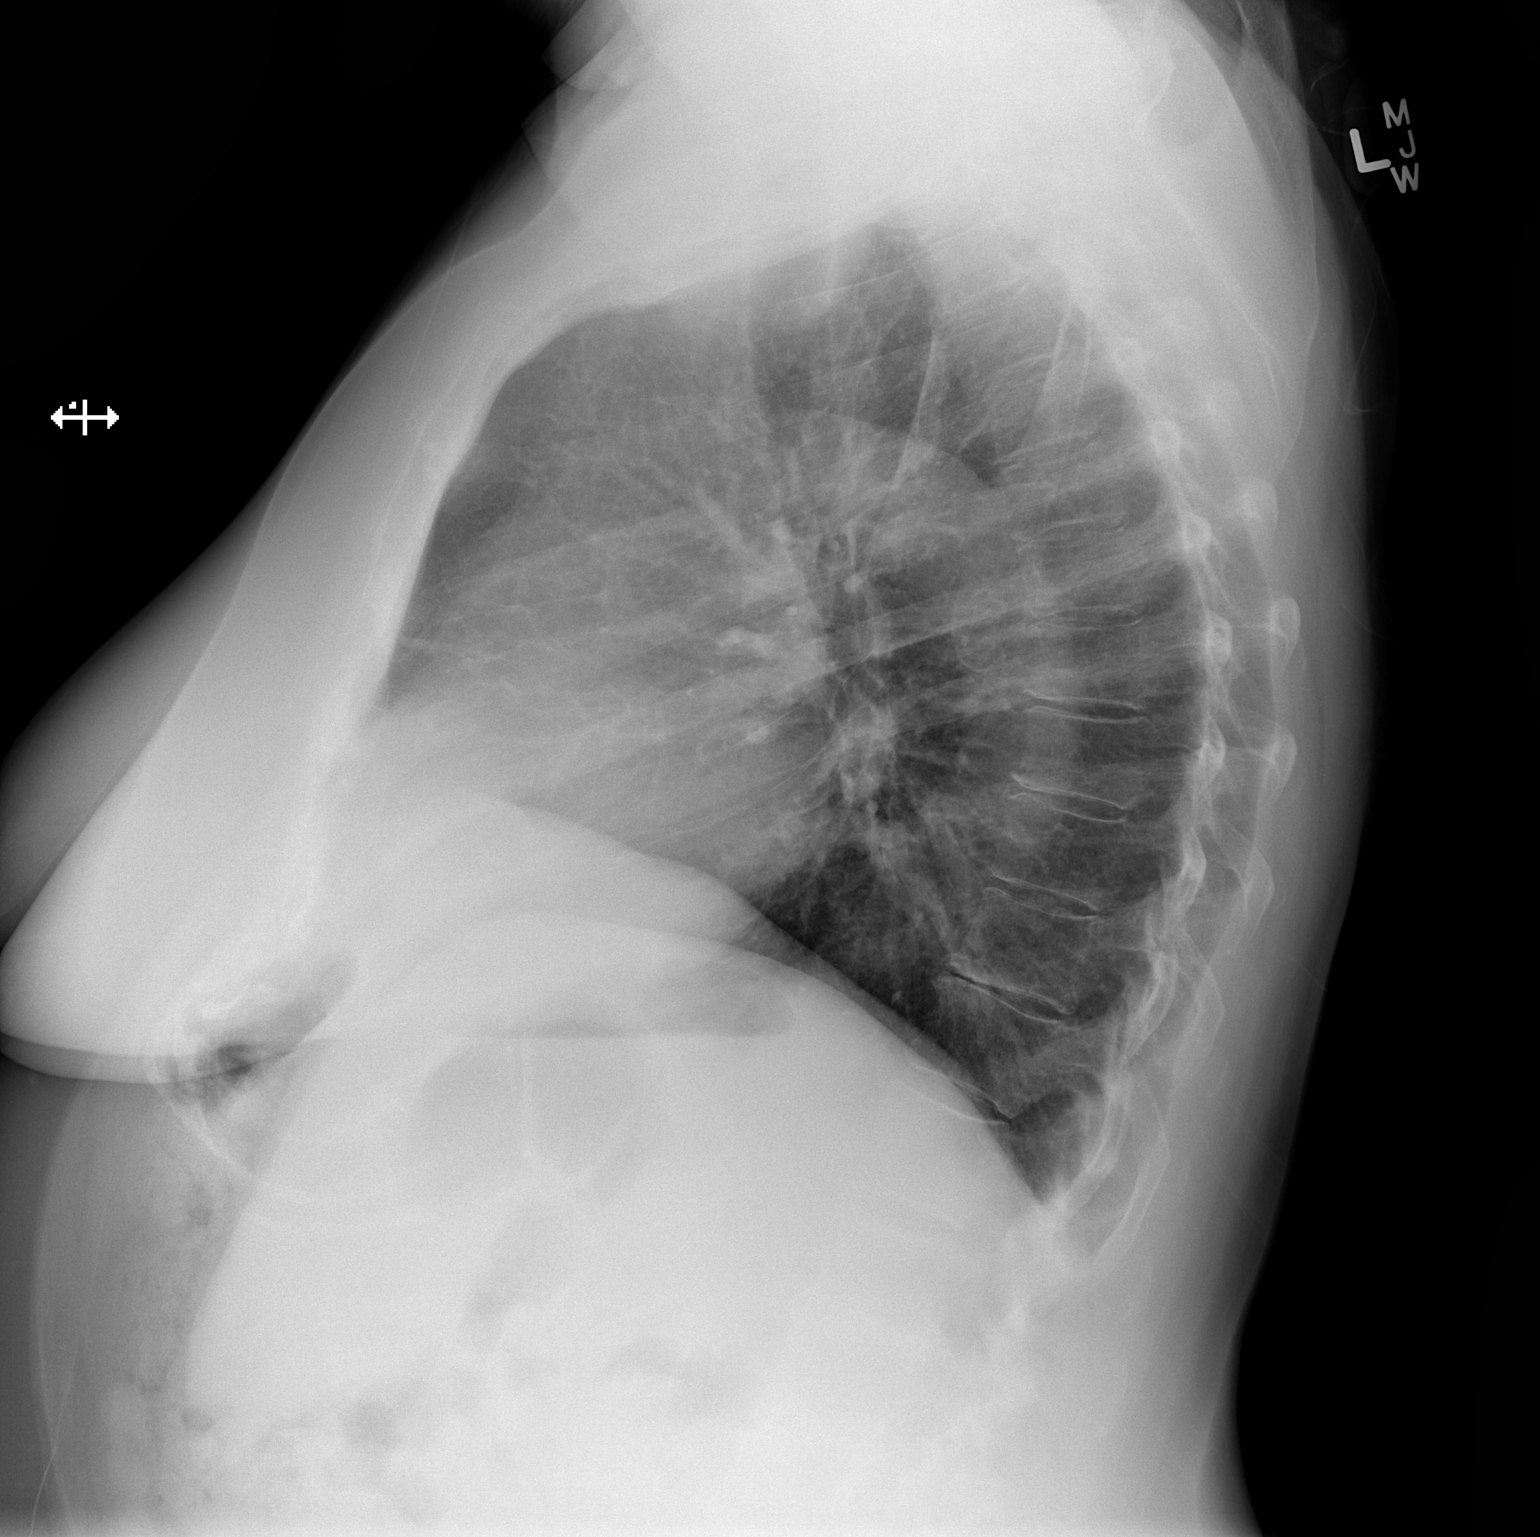

[2 of 2 positions shown; findings below may reference images not displayed]

FINDINGS: Normal heart size. No pleural effusion or edema identified. No
airspace consolidation. Scar like densities noted in both lung
bases.
IMPRESSION: 1. Bibasilar scarring.
2. No acute findings noted.

## 2017-04-14 DIAGNOSIS — I10 Essential (primary) hypertension: Secondary | ICD-10-CM | POA: Diagnosis not present

## 2017-04-14 DIAGNOSIS — M19011 Primary osteoarthritis, right shoulder: Secondary | ICD-10-CM | POA: Diagnosis not present

## 2017-04-14 DIAGNOSIS — E876 Hypokalemia: Secondary | ICD-10-CM | POA: Diagnosis not present

## 2017-04-22 DIAGNOSIS — I10 Essential (primary) hypertension: Secondary | ICD-10-CM | POA: Diagnosis not present

## 2017-04-22 DIAGNOSIS — E876 Hypokalemia: Secondary | ICD-10-CM | POA: Diagnosis not present

## 2017-04-22 DIAGNOSIS — M25511 Pain in right shoulder: Secondary | ICD-10-CM | POA: Diagnosis not present

## 2017-04-22 DIAGNOSIS — G8918 Other acute postprocedural pain: Secondary | ICD-10-CM | POA: Diagnosis not present

## 2017-04-22 DIAGNOSIS — M19011 Primary osteoarthritis, right shoulder: Secondary | ICD-10-CM | POA: Diagnosis not present

## 2017-04-22 DIAGNOSIS — Z96611 Presence of right artificial shoulder joint: Secondary | ICD-10-CM | POA: Diagnosis not present

## 2017-05-06 DIAGNOSIS — M25511 Pain in right shoulder: Secondary | ICD-10-CM | POA: Diagnosis not present

## 2017-05-06 DIAGNOSIS — M25611 Stiffness of right shoulder, not elsewhere classified: Secondary | ICD-10-CM | POA: Diagnosis not present

## 2017-05-11 DIAGNOSIS — M25611 Stiffness of right shoulder, not elsewhere classified: Secondary | ICD-10-CM | POA: Diagnosis not present

## 2017-05-11 DIAGNOSIS — M25511 Pain in right shoulder: Secondary | ICD-10-CM | POA: Diagnosis not present

## 2017-05-12 DIAGNOSIS — Z96611 Presence of right artificial shoulder joint: Secondary | ICD-10-CM | POA: Diagnosis not present

## 2017-05-12 DIAGNOSIS — Z471 Aftercare following joint replacement surgery: Secondary | ICD-10-CM | POA: Diagnosis not present

## 2017-05-13 DIAGNOSIS — M25611 Stiffness of right shoulder, not elsewhere classified: Secondary | ICD-10-CM | POA: Diagnosis not present

## 2017-05-13 DIAGNOSIS — M25511 Pain in right shoulder: Secondary | ICD-10-CM | POA: Diagnosis not present

## 2017-05-15 DIAGNOSIS — M25511 Pain in right shoulder: Secondary | ICD-10-CM | POA: Diagnosis not present

## 2017-05-15 DIAGNOSIS — M25611 Stiffness of right shoulder, not elsewhere classified: Secondary | ICD-10-CM | POA: Diagnosis not present

## 2017-05-20 DIAGNOSIS — M25511 Pain in right shoulder: Secondary | ICD-10-CM | POA: Diagnosis not present

## 2017-05-20 DIAGNOSIS — M25611 Stiffness of right shoulder, not elsewhere classified: Secondary | ICD-10-CM | POA: Diagnosis not present

## 2017-05-22 DIAGNOSIS — M25511 Pain in right shoulder: Secondary | ICD-10-CM | POA: Diagnosis not present

## 2017-05-22 DIAGNOSIS — M25611 Stiffness of right shoulder, not elsewhere classified: Secondary | ICD-10-CM | POA: Diagnosis not present

## 2017-05-25 DIAGNOSIS — M25611 Stiffness of right shoulder, not elsewhere classified: Secondary | ICD-10-CM | POA: Diagnosis not present

## 2017-05-25 DIAGNOSIS — M25511 Pain in right shoulder: Secondary | ICD-10-CM | POA: Diagnosis not present

## 2017-05-27 DIAGNOSIS — M25511 Pain in right shoulder: Secondary | ICD-10-CM | POA: Diagnosis not present

## 2017-05-27 DIAGNOSIS — M25611 Stiffness of right shoulder, not elsewhere classified: Secondary | ICD-10-CM | POA: Diagnosis not present

## 2017-05-29 DIAGNOSIS — J0100 Acute maxillary sinusitis, unspecified: Secondary | ICD-10-CM | POA: Diagnosis not present

## 2017-05-29 DIAGNOSIS — M25511 Pain in right shoulder: Secondary | ICD-10-CM | POA: Diagnosis not present

## 2017-05-29 DIAGNOSIS — M25611 Stiffness of right shoulder, not elsewhere classified: Secondary | ICD-10-CM | POA: Diagnosis not present

## 2017-06-01 DIAGNOSIS — M25611 Stiffness of right shoulder, not elsewhere classified: Secondary | ICD-10-CM | POA: Diagnosis not present

## 2017-06-01 DIAGNOSIS — M25511 Pain in right shoulder: Secondary | ICD-10-CM | POA: Diagnosis not present

## 2017-06-05 DIAGNOSIS — M25511 Pain in right shoulder: Secondary | ICD-10-CM | POA: Diagnosis not present

## 2017-06-05 DIAGNOSIS — M25611 Stiffness of right shoulder, not elsewhere classified: Secondary | ICD-10-CM | POA: Diagnosis not present

## 2017-06-08 DIAGNOSIS — M25611 Stiffness of right shoulder, not elsewhere classified: Secondary | ICD-10-CM | POA: Diagnosis not present

## 2017-06-08 DIAGNOSIS — M25511 Pain in right shoulder: Secondary | ICD-10-CM | POA: Diagnosis not present

## 2017-06-10 DIAGNOSIS — M25511 Pain in right shoulder: Secondary | ICD-10-CM | POA: Diagnosis not present

## 2017-06-10 DIAGNOSIS — M25611 Stiffness of right shoulder, not elsewhere classified: Secondary | ICD-10-CM | POA: Diagnosis not present

## 2017-06-12 DIAGNOSIS — M25611 Stiffness of right shoulder, not elsewhere classified: Secondary | ICD-10-CM | POA: Diagnosis not present

## 2017-06-12 DIAGNOSIS — M25511 Pain in right shoulder: Secondary | ICD-10-CM | POA: Diagnosis not present

## 2017-06-16 DIAGNOSIS — Z471 Aftercare following joint replacement surgery: Secondary | ICD-10-CM | POA: Diagnosis not present

## 2017-06-16 DIAGNOSIS — Z96611 Presence of right artificial shoulder joint: Secondary | ICD-10-CM | POA: Diagnosis not present

## 2017-06-16 DIAGNOSIS — M25611 Stiffness of right shoulder, not elsewhere classified: Secondary | ICD-10-CM | POA: Diagnosis not present

## 2017-06-16 DIAGNOSIS — M25511 Pain in right shoulder: Secondary | ICD-10-CM | POA: Diagnosis not present

## 2017-06-19 DIAGNOSIS — M25611 Stiffness of right shoulder, not elsewhere classified: Secondary | ICD-10-CM | POA: Diagnosis not present

## 2017-06-19 DIAGNOSIS — M25511 Pain in right shoulder: Secondary | ICD-10-CM | POA: Diagnosis not present

## 2017-06-26 DIAGNOSIS — M25511 Pain in right shoulder: Secondary | ICD-10-CM | POA: Diagnosis not present

## 2017-06-26 DIAGNOSIS — M25611 Stiffness of right shoulder, not elsewhere classified: Secondary | ICD-10-CM | POA: Diagnosis not present

## 2017-06-29 DIAGNOSIS — M25611 Stiffness of right shoulder, not elsewhere classified: Secondary | ICD-10-CM | POA: Diagnosis not present

## 2017-06-29 DIAGNOSIS — M25511 Pain in right shoulder: Secondary | ICD-10-CM | POA: Diagnosis not present

## 2017-07-01 DIAGNOSIS — M25511 Pain in right shoulder: Secondary | ICD-10-CM | POA: Diagnosis not present

## 2017-07-01 DIAGNOSIS — M25611 Stiffness of right shoulder, not elsewhere classified: Secondary | ICD-10-CM | POA: Diagnosis not present

## 2017-07-14 DIAGNOSIS — M25511 Pain in right shoulder: Secondary | ICD-10-CM | POA: Diagnosis not present

## 2017-07-14 DIAGNOSIS — M25611 Stiffness of right shoulder, not elsewhere classified: Secondary | ICD-10-CM | POA: Diagnosis not present

## 2017-07-17 DIAGNOSIS — M25611 Stiffness of right shoulder, not elsewhere classified: Secondary | ICD-10-CM | POA: Diagnosis not present

## 2017-07-17 DIAGNOSIS — M25511 Pain in right shoulder: Secondary | ICD-10-CM | POA: Diagnosis not present

## 2017-07-28 DIAGNOSIS — Z96611 Presence of right artificial shoulder joint: Secondary | ICD-10-CM | POA: Diagnosis not present

## 2017-07-28 DIAGNOSIS — Z471 Aftercare following joint replacement surgery: Secondary | ICD-10-CM | POA: Diagnosis not present

## 2017-10-27 DIAGNOSIS — M5430 Sciatica, unspecified side: Secondary | ICD-10-CM | POA: Diagnosis not present

## 2017-11-09 DIAGNOSIS — I129 Hypertensive chronic kidney disease with stage 1 through stage 4 chronic kidney disease, or unspecified chronic kidney disease: Secondary | ICD-10-CM | POA: Diagnosis not present

## 2017-11-09 DIAGNOSIS — N183 Chronic kidney disease, stage 3 (moderate): Secondary | ICD-10-CM | POA: Diagnosis not present

## 2017-11-09 DIAGNOSIS — Z0000 Encounter for general adult medical examination without abnormal findings: Secondary | ICD-10-CM | POA: Diagnosis not present

## 2017-11-09 DIAGNOSIS — M545 Low back pain: Secondary | ICD-10-CM | POA: Diagnosis not present

## 2017-11-09 DIAGNOSIS — Z79899 Other long term (current) drug therapy: Secondary | ICD-10-CM | POA: Diagnosis not present

## 2017-11-09 DIAGNOSIS — E785 Hyperlipidemia, unspecified: Secondary | ICD-10-CM | POA: Diagnosis not present

## 2017-11-18 DIAGNOSIS — Z1211 Encounter for screening for malignant neoplasm of colon: Secondary | ICD-10-CM | POA: Diagnosis not present

## 2017-12-01 DIAGNOSIS — E0590 Thyrotoxicosis, unspecified without thyrotoxic crisis or storm: Secondary | ICD-10-CM | POA: Diagnosis not present

## 2017-12-15 DIAGNOSIS — E0590 Thyrotoxicosis, unspecified without thyrotoxic crisis or storm: Secondary | ICD-10-CM | POA: Diagnosis not present

## 2017-12-15 DIAGNOSIS — E042 Nontoxic multinodular goiter: Secondary | ICD-10-CM | POA: Diagnosis not present

## 2018-02-08 DIAGNOSIS — J329 Chronic sinusitis, unspecified: Secondary | ICD-10-CM | POA: Diagnosis not present

## 2018-05-05 DIAGNOSIS — M545 Low back pain: Secondary | ICD-10-CM | POA: Diagnosis not present

## 2018-05-17 DIAGNOSIS — M4156 Other secondary scoliosis, lumbar region: Secondary | ICD-10-CM | POA: Diagnosis not present

## 2018-05-17 DIAGNOSIS — E0590 Thyrotoxicosis, unspecified without thyrotoxic crisis or storm: Secondary | ICD-10-CM | POA: Diagnosis not present

## 2018-05-17 DIAGNOSIS — E785 Hyperlipidemia, unspecified: Secondary | ICD-10-CM | POA: Diagnosis not present

## 2018-05-17 DIAGNOSIS — I129 Hypertensive chronic kidney disease with stage 1 through stage 4 chronic kidney disease, or unspecified chronic kidney disease: Secondary | ICD-10-CM | POA: Diagnosis not present

## 2018-05-17 DIAGNOSIS — N183 Chronic kidney disease, stage 3 (moderate): Secondary | ICD-10-CM | POA: Diagnosis not present

## 2018-05-28 DIAGNOSIS — M5116 Intervertebral disc disorders with radiculopathy, lumbar region: Secondary | ICD-10-CM | POA: Diagnosis not present

## 2018-05-28 DIAGNOSIS — M48061 Spinal stenosis, lumbar region without neurogenic claudication: Secondary | ICD-10-CM | POA: Diagnosis not present

## 2018-05-28 DIAGNOSIS — M5416 Radiculopathy, lumbar region: Secondary | ICD-10-CM | POA: Diagnosis not present

## 2018-06-01 DIAGNOSIS — M545 Low back pain: Secondary | ICD-10-CM | POA: Diagnosis not present

## 2018-06-30 DIAGNOSIS — G894 Chronic pain syndrome: Secondary | ICD-10-CM | POA: Diagnosis not present

## 2018-06-30 DIAGNOSIS — M792 Neuralgia and neuritis, unspecified: Secondary | ICD-10-CM | POA: Diagnosis not present

## 2018-06-30 DIAGNOSIS — M545 Low back pain: Secondary | ICD-10-CM | POA: Diagnosis not present

## 2018-07-28 DIAGNOSIS — M792 Neuralgia and neuritis, unspecified: Secondary | ICD-10-CM | POA: Diagnosis not present

## 2018-07-28 DIAGNOSIS — M545 Low back pain: Secondary | ICD-10-CM | POA: Diagnosis not present

## 2018-08-24 DIAGNOSIS — R600 Localized edema: Secondary | ICD-10-CM | POA: Diagnosis not present

## 2018-08-24 DIAGNOSIS — R531 Weakness: Secondary | ICD-10-CM | POA: Diagnosis not present

## 2018-08-24 DIAGNOSIS — T464X5A Adverse effect of angiotensin-converting-enzyme inhibitors, initial encounter: Secondary | ICD-10-CM | POA: Diagnosis not present

## 2018-11-10 DIAGNOSIS — G894 Chronic pain syndrome: Secondary | ICD-10-CM | POA: Diagnosis not present

## 2018-11-10 DIAGNOSIS — M4316 Spondylolisthesis, lumbar region: Secondary | ICD-10-CM | POA: Diagnosis not present

## 2018-11-10 DIAGNOSIS — M545 Low back pain: Secondary | ICD-10-CM | POA: Diagnosis not present

## 2018-12-22 DIAGNOSIS — M130 Polyarthritis, unspecified: Secondary | ICD-10-CM | POA: Diagnosis not present

## 2018-12-22 DIAGNOSIS — G894 Chronic pain syndrome: Secondary | ICD-10-CM | POA: Diagnosis not present

## 2018-12-22 DIAGNOSIS — M62838 Other muscle spasm: Secondary | ICD-10-CM | POA: Diagnosis not present

## 2019-01-26 DIAGNOSIS — G894 Chronic pain syndrome: Secondary | ICD-10-CM | POA: Diagnosis not present

## 2019-01-26 DIAGNOSIS — M4316 Spondylolisthesis, lumbar region: Secondary | ICD-10-CM | POA: Diagnosis not present

## 2019-03-02 DIAGNOSIS — M791 Myalgia, unspecified site: Secondary | ICD-10-CM | POA: Diagnosis not present

## 2019-03-02 DIAGNOSIS — M792 Neuralgia and neuritis, unspecified: Secondary | ICD-10-CM | POA: Diagnosis not present

## 2019-03-02 DIAGNOSIS — M130 Polyarthritis, unspecified: Secondary | ICD-10-CM | POA: Diagnosis not present

## 2019-03-02 DIAGNOSIS — M62838 Other muscle spasm: Secondary | ICD-10-CM | POA: Diagnosis not present

## 2019-04-05 DIAGNOSIS — L299 Pruritus, unspecified: Secondary | ICD-10-CM | POA: Diagnosis not present

## 2019-04-05 DIAGNOSIS — I10 Essential (primary) hypertension: Secondary | ICD-10-CM | POA: Diagnosis not present

## 2019-05-04 DIAGNOSIS — E785 Hyperlipidemia, unspecified: Secondary | ICD-10-CM | POA: Diagnosis not present

## 2019-05-04 DIAGNOSIS — E0590 Thyrotoxicosis, unspecified without thyrotoxic crisis or storm: Secondary | ICD-10-CM | POA: Diagnosis not present

## 2019-05-04 DIAGNOSIS — I129 Hypertensive chronic kidney disease with stage 1 through stage 4 chronic kidney disease, or unspecified chronic kidney disease: Secondary | ICD-10-CM | POA: Diagnosis not present

## 2019-05-04 DIAGNOSIS — Z0000 Encounter for general adult medical examination without abnormal findings: Secondary | ICD-10-CM | POA: Diagnosis not present

## 2019-05-04 DIAGNOSIS — N183 Chronic kidney disease, stage 3 unspecified: Secondary | ICD-10-CM | POA: Diagnosis not present

## 2019-05-04 DIAGNOSIS — R739 Hyperglycemia, unspecified: Secondary | ICD-10-CM | POA: Diagnosis not present

## 2019-05-21 DIAGNOSIS — L509 Urticaria, unspecified: Secondary | ICD-10-CM | POA: Diagnosis not present

## 2019-06-02 DIAGNOSIS — R0989 Other specified symptoms and signs involving the circulatory and respiratory systems: Secondary | ICD-10-CM | POA: Diagnosis not present

## 2019-06-02 DIAGNOSIS — N183 Chronic kidney disease, stage 3 unspecified: Secondary | ICD-10-CM | POA: Diagnosis not present

## 2019-06-02 DIAGNOSIS — L299 Pruritus, unspecified: Secondary | ICD-10-CM | POA: Diagnosis not present

## 2019-06-02 DIAGNOSIS — I129 Hypertensive chronic kidney disease with stage 1 through stage 4 chronic kidney disease, or unspecified chronic kidney disease: Secondary | ICD-10-CM | POA: Diagnosis not present

## 2019-06-09 DIAGNOSIS — M62838 Other muscle spasm: Secondary | ICD-10-CM | POA: Diagnosis not present

## 2019-06-09 DIAGNOSIS — G894 Chronic pain syndrome: Secondary | ICD-10-CM | POA: Diagnosis not present

## 2019-06-09 DIAGNOSIS — M545 Low back pain: Secondary | ICD-10-CM | POA: Diagnosis not present

## 2019-06-09 DIAGNOSIS — M791 Myalgia, unspecified site: Secondary | ICD-10-CM | POA: Diagnosis not present

## 2019-06-09 DIAGNOSIS — M4316 Spondylolisthesis, lumbar region: Secondary | ICD-10-CM | POA: Diagnosis not present

## 2019-06-20 DIAGNOSIS — L299 Pruritus, unspecified: Secondary | ICD-10-CM | POA: Diagnosis not present

## 2019-06-20 DIAGNOSIS — L300 Nummular dermatitis: Secondary | ICD-10-CM | POA: Diagnosis not present

## 2019-07-04 DIAGNOSIS — L300 Nummular dermatitis: Secondary | ICD-10-CM | POA: Diagnosis not present

## 2019-09-16 DIAGNOSIS — M545 Low back pain: Secondary | ICD-10-CM | POA: Diagnosis not present

## 2019-09-16 DIAGNOSIS — G894 Chronic pain syndrome: Secondary | ICD-10-CM | POA: Diagnosis not present

## 2019-09-30 DIAGNOSIS — M62838 Other muscle spasm: Secondary | ICD-10-CM | POA: Diagnosis not present

## 2019-09-30 DIAGNOSIS — M461 Sacroiliitis, not elsewhere classified: Secondary | ICD-10-CM | POA: Diagnosis not present

## 2019-09-30 DIAGNOSIS — M4316 Spondylolisthesis, lumbar region: Secondary | ICD-10-CM | POA: Diagnosis not present

## 2019-09-30 DIAGNOSIS — M130 Polyarthritis, unspecified: Secondary | ICD-10-CM | POA: Diagnosis not present

## 2019-09-30 DIAGNOSIS — M791 Myalgia, unspecified site: Secondary | ICD-10-CM | POA: Diagnosis not present

## 2019-11-14 DIAGNOSIS — R0989 Other specified symptoms and signs involving the circulatory and respiratory systems: Secondary | ICD-10-CM | POA: Diagnosis not present

## 2019-11-14 DIAGNOSIS — N183 Chronic kidney disease, stage 3 unspecified: Secondary | ICD-10-CM | POA: Diagnosis not present

## 2019-11-14 DIAGNOSIS — E0590 Thyrotoxicosis, unspecified without thyrotoxic crisis or storm: Secondary | ICD-10-CM | POA: Diagnosis not present

## 2019-11-14 DIAGNOSIS — M4186 Other forms of scoliosis, lumbar region: Secondary | ICD-10-CM | POA: Diagnosis not present

## 2019-11-14 DIAGNOSIS — I129 Hypertensive chronic kidney disease with stage 1 through stage 4 chronic kidney disease, or unspecified chronic kidney disease: Secondary | ICD-10-CM | POA: Diagnosis not present

## 2020-01-05 DIAGNOSIS — M7060 Trochanteric bursitis, unspecified hip: Secondary | ICD-10-CM | POA: Diagnosis not present

## 2020-01-05 DIAGNOSIS — M4316 Spondylolisthesis, lumbar region: Secondary | ICD-10-CM | POA: Diagnosis not present

## 2020-01-05 DIAGNOSIS — M62838 Other muscle spasm: Secondary | ICD-10-CM | POA: Diagnosis not present

## 2020-01-05 DIAGNOSIS — M461 Sacroiliitis, not elsewhere classified: Secondary | ICD-10-CM | POA: Diagnosis not present

## 2020-03-14 DIAGNOSIS — M4150 Other secondary scoliosis, site unspecified: Secondary | ICD-10-CM | POA: Diagnosis not present

## 2020-03-14 DIAGNOSIS — M545 Low back pain, unspecified: Secondary | ICD-10-CM | POA: Diagnosis not present

## 2020-03-14 DIAGNOSIS — M4716 Other spondylosis with myelopathy, lumbar region: Secondary | ICD-10-CM | POA: Diagnosis not present

## 2020-03-22 ENCOUNTER — Other Ambulatory Visit: Payer: Self-pay | Admitting: Orthopaedic Surgery

## 2020-03-22 DIAGNOSIS — M4150 Other secondary scoliosis, site unspecified: Secondary | ICD-10-CM

## 2020-03-22 DIAGNOSIS — M546 Pain in thoracic spine: Secondary | ICD-10-CM

## 2020-04-16 ENCOUNTER — Ambulatory Visit
Admission: RE | Admit: 2020-04-16 | Discharge: 2020-04-16 | Disposition: A | Discharge status: Home / Self Care | Payer: Medicare Other | Source: Ambulatory Visit | Attending: Orthopaedic Surgery | Admitting: Orthopaedic Surgery

## 2020-04-16 ENCOUNTER — Other Ambulatory Visit: Payer: Self-pay

## 2020-04-16 DIAGNOSIS — M5134 Other intervertebral disc degeneration, thoracic region: Secondary | ICD-10-CM | POA: Diagnosis not present

## 2020-04-16 DIAGNOSIS — M546 Pain in thoracic spine: Secondary | ICD-10-CM

## 2020-04-16 DIAGNOSIS — M4150 Other secondary scoliosis, site unspecified: Secondary | ICD-10-CM

## 2020-04-16 DIAGNOSIS — R200 Anesthesia of skin: Secondary | ICD-10-CM | POA: Diagnosis not present

## 2020-04-16 DIAGNOSIS — M48061 Spinal stenosis, lumbar region without neurogenic claudication: Secondary | ICD-10-CM | POA: Diagnosis not present

## 2020-04-16 DIAGNOSIS — M5135 Other intervertebral disc degeneration, thoracolumbar region: Secondary | ICD-10-CM | POA: Diagnosis not present

## 2020-04-16 DIAGNOSIS — M5127 Other intervertebral disc displacement, lumbosacral region: Secondary | ICD-10-CM | POA: Diagnosis not present

## 2020-04-26 DIAGNOSIS — M4150 Other secondary scoliosis, site unspecified: Secondary | ICD-10-CM | POA: Diagnosis not present

## 2020-04-26 DIAGNOSIS — M47816 Spondylosis without myelopathy or radiculopathy, lumbar region: Secondary | ICD-10-CM | POA: Diagnosis not present

## 2020-05-02 DIAGNOSIS — I129 Hypertensive chronic kidney disease with stage 1 through stage 4 chronic kidney disease, or unspecified chronic kidney disease: Secondary | ICD-10-CM | POA: Diagnosis not present

## 2020-05-02 DIAGNOSIS — Z0000 Encounter for general adult medical examination without abnormal findings: Secondary | ICD-10-CM | POA: Diagnosis not present

## 2020-05-02 DIAGNOSIS — E0590 Thyrotoxicosis, unspecified without thyrotoxic crisis or storm: Secondary | ICD-10-CM | POA: Diagnosis not present

## 2020-05-02 DIAGNOSIS — E785 Hyperlipidemia, unspecified: Secondary | ICD-10-CM | POA: Diagnosis not present

## 2020-05-02 DIAGNOSIS — Z79899 Other long term (current) drug therapy: Secondary | ICD-10-CM | POA: Diagnosis not present

## 2020-05-02 DIAGNOSIS — N1831 Chronic kidney disease, stage 3a: Secondary | ICD-10-CM | POA: Diagnosis not present

## 2020-05-02 DIAGNOSIS — R7302 Impaired glucose tolerance (oral): Secondary | ICD-10-CM | POA: Diagnosis not present

## 2020-05-07 DIAGNOSIS — M47816 Spondylosis without myelopathy or radiculopathy, lumbar region: Secondary | ICD-10-CM | POA: Diagnosis not present

## 2020-05-28 DIAGNOSIS — M539 Dorsopathy, unspecified: Secondary | ICD-10-CM | POA: Diagnosis not present

## 2020-05-28 DIAGNOSIS — N1831 Chronic kidney disease, stage 3a: Secondary | ICD-10-CM | POA: Diagnosis not present

## 2020-05-28 DIAGNOSIS — M4186 Other forms of scoliosis, lumbar region: Secondary | ICD-10-CM | POA: Diagnosis not present

## 2020-05-28 DIAGNOSIS — E0590 Thyrotoxicosis, unspecified without thyrotoxic crisis or storm: Secondary | ICD-10-CM | POA: Diagnosis not present

## 2020-05-28 DIAGNOSIS — I129 Hypertensive chronic kidney disease with stage 1 through stage 4 chronic kidney disease, or unspecified chronic kidney disease: Secondary | ICD-10-CM | POA: Diagnosis not present

## 2020-06-26 DIAGNOSIS — M47816 Spondylosis without myelopathy or radiculopathy, lumbar region: Secondary | ICD-10-CM | POA: Diagnosis not present

## 2020-07-24 DIAGNOSIS — M4150 Other secondary scoliosis, site unspecified: Secondary | ICD-10-CM | POA: Diagnosis not present

## 2020-07-24 DIAGNOSIS — M47816 Spondylosis without myelopathy or radiculopathy, lumbar region: Secondary | ICD-10-CM | POA: Diagnosis not present

## 2020-07-24 DIAGNOSIS — M4716 Other spondylosis with myelopathy, lumbar region: Secondary | ICD-10-CM | POA: Diagnosis not present

## 2020-07-30 DIAGNOSIS — M5116 Intervertebral disc disorders with radiculopathy, lumbar region: Secondary | ICD-10-CM | POA: Diagnosis not present

## 2020-08-15 DIAGNOSIS — M4716 Other spondylosis with myelopathy, lumbar region: Secondary | ICD-10-CM | POA: Diagnosis not present

## 2020-08-15 DIAGNOSIS — M4150 Other secondary scoliosis, site unspecified: Secondary | ICD-10-CM | POA: Diagnosis not present

## 2020-08-15 DIAGNOSIS — M5116 Intervertebral disc disorders with radiculopathy, lumbar region: Secondary | ICD-10-CM | POA: Diagnosis not present

## 2020-11-02 DIAGNOSIS — I129 Hypertensive chronic kidney disease with stage 1 through stage 4 chronic kidney disease, or unspecified chronic kidney disease: Secondary | ICD-10-CM | POA: Diagnosis not present

## 2020-11-02 DIAGNOSIS — M4186 Other forms of scoliosis, lumbar region: Secondary | ICD-10-CM | POA: Diagnosis not present

## 2020-11-02 DIAGNOSIS — R7301 Impaired fasting glucose: Secondary | ICD-10-CM | POA: Diagnosis not present

## 2020-11-02 DIAGNOSIS — T466X5A Adverse effect of antihyperlipidemic and antiarteriosclerotic drugs, initial encounter: Secondary | ICD-10-CM | POA: Diagnosis not present

## 2020-11-02 DIAGNOSIS — N1831 Chronic kidney disease, stage 3a: Secondary | ICD-10-CM | POA: Diagnosis not present

## 2020-11-02 DIAGNOSIS — E785 Hyperlipidemia, unspecified: Secondary | ICD-10-CM | POA: Diagnosis not present

## 2020-11-02 DIAGNOSIS — Z79899 Other long term (current) drug therapy: Secondary | ICD-10-CM | POA: Diagnosis not present

## 2020-11-02 DIAGNOSIS — M47819 Spondylosis without myelopathy or radiculopathy, site unspecified: Secondary | ICD-10-CM | POA: Diagnosis not present

## 2020-11-02 DIAGNOSIS — Z0000 Encounter for general adult medical examination without abnormal findings: Secondary | ICD-10-CM | POA: Diagnosis not present

## 2020-11-02 DIAGNOSIS — E0590 Thyrotoxicosis, unspecified without thyrotoxic crisis or storm: Secondary | ICD-10-CM | POA: Diagnosis not present

## 2020-11-02 DIAGNOSIS — G720 Drug-induced myopathy: Secondary | ICD-10-CM | POA: Diagnosis not present

## 2020-11-23 DIAGNOSIS — I10 Essential (primary) hypertension: Secondary | ICD-10-CM | POA: Diagnosis not present

## 2020-11-23 DIAGNOSIS — M4180 Other forms of scoliosis, site unspecified: Secondary | ICD-10-CM | POA: Diagnosis not present

## 2020-11-23 DIAGNOSIS — M545 Low back pain, unspecified: Secondary | ICD-10-CM | POA: Diagnosis not present

## 2020-11-23 DIAGNOSIS — G8929 Other chronic pain: Secondary | ICD-10-CM | POA: Diagnosis not present

## 2020-12-03 ENCOUNTER — Other Ambulatory Visit: Payer: Self-pay | Admitting: Neurosurgery

## 2020-12-03 ENCOUNTER — Telehealth: Payer: Self-pay

## 2020-12-03 DIAGNOSIS — M4180 Other forms of scoliosis, site unspecified: Secondary | ICD-10-CM

## 2020-12-03 DIAGNOSIS — M4150 Other secondary scoliosis, site unspecified: Secondary | ICD-10-CM

## 2020-12-03 NOTE — Telephone Encounter (Signed)
Spoke with patient for [place] to screen her medications before getting her scheduled for a lumbar myelogram.  She stated an understanding that she will be here two hours and will need a driver.  She has no medications to hold.

## 2020-12-04 ENCOUNTER — Ambulatory Visit
Admission: RE | Admit: 2020-12-04 | Discharge: 2020-12-04 | Disposition: A | Discharge status: Home / Self Care | Payer: Medicare Other | Source: Ambulatory Visit | Attending: Neurosurgery | Admitting: Neurosurgery

## 2020-12-04 ENCOUNTER — Other Ambulatory Visit: Payer: Self-pay

## 2020-12-04 DIAGNOSIS — M4316 Spondylolisthesis, lumbar region: Secondary | ICD-10-CM | POA: Diagnosis not present

## 2020-12-04 DIAGNOSIS — M4180 Other forms of scoliosis, site unspecified: Secondary | ICD-10-CM

## 2020-12-04 DIAGNOSIS — M4150 Other secondary scoliosis, site unspecified: Secondary | ICD-10-CM

## 2020-12-04 DIAGNOSIS — M47816 Spondylosis without myelopathy or radiculopathy, lumbar region: Secondary | ICD-10-CM | POA: Diagnosis not present

## 2020-12-04 DIAGNOSIS — M4126 Other idiopathic scoliosis, lumbar region: Secondary | ICD-10-CM | POA: Diagnosis not present

## 2020-12-04 MED ORDER — DIAZEPAM 5 MG PO TABS
5.0000 mg | ORAL_TABLET | Freq: Once | ORAL | Status: AC
  Administered 2020-12-04: 5 mg via ORAL

## 2020-12-04 MED ORDER — IOPAMIDOL (ISOVUE-M 200) INJECTION 41%
15.0000 mL | Freq: Once | INTRAMUSCULAR | Status: AC
  Administered 2020-12-04: 15 mL via INTRATHECAL

## 2020-12-04 NOTE — Discharge Instructions (Signed)

## 2021-11-27 DIAGNOSIS — S52502D Unspecified fracture of the lower end of left radius, subsequent encounter for closed fracture with routine healing: Secondary | ICD-10-CM | POA: Diagnosis not present

## 2021-12-03 DIAGNOSIS — S52502D Unspecified fracture of the lower end of left radius, subsequent encounter for closed fracture with routine healing: Secondary | ICD-10-CM | POA: Diagnosis not present

## 2021-12-10 DIAGNOSIS — S52502D Unspecified fracture of the lower end of left radius, subsequent encounter for closed fracture with routine healing: Secondary | ICD-10-CM | POA: Diagnosis not present

## 2021-12-17 DIAGNOSIS — S52502D Unspecified fracture of the lower end of left radius, subsequent encounter for closed fracture with routine healing: Secondary | ICD-10-CM | POA: Diagnosis not present

## 2021-12-20 DIAGNOSIS — M47819 Spondylosis without myelopathy or radiculopathy, site unspecified: Secondary | ICD-10-CM | POA: Diagnosis not present

## 2021-12-20 DIAGNOSIS — E785 Hyperlipidemia, unspecified: Secondary | ICD-10-CM | POA: Diagnosis not present

## 2021-12-20 DIAGNOSIS — R7301 Impaired fasting glucose: Secondary | ICD-10-CM | POA: Diagnosis not present

## 2021-12-20 DIAGNOSIS — Z0000 Encounter for general adult medical examination without abnormal findings: Secondary | ICD-10-CM | POA: Diagnosis not present

## 2021-12-20 DIAGNOSIS — N1831 Chronic kidney disease, stage 3a: Secondary | ICD-10-CM | POA: Diagnosis not present

## 2021-12-20 DIAGNOSIS — Z79899 Other long term (current) drug therapy: Secondary | ICD-10-CM | POA: Diagnosis not present

## 2021-12-20 DIAGNOSIS — E0590 Thyrotoxicosis, unspecified without thyrotoxic crisis or storm: Secondary | ICD-10-CM | POA: Diagnosis not present

## 2021-12-20 DIAGNOSIS — T466X5A Adverse effect of antihyperlipidemic and antiarteriosclerotic drugs, initial encounter: Secondary | ICD-10-CM | POA: Diagnosis not present

## 2021-12-20 DIAGNOSIS — G720 Drug-induced myopathy: Secondary | ICD-10-CM | POA: Diagnosis not present

## 2021-12-20 DIAGNOSIS — M4156 Other secondary scoliosis, lumbar region: Secondary | ICD-10-CM | POA: Diagnosis not present

## 2021-12-20 DIAGNOSIS — I129 Hypertensive chronic kidney disease with stage 1 through stage 4 chronic kidney disease, or unspecified chronic kidney disease: Secondary | ICD-10-CM | POA: Diagnosis not present

## 2022-01-02 DIAGNOSIS — S52502S Unspecified fracture of the lower end of left radius, sequela: Secondary | ICD-10-CM | POA: Diagnosis not present

## 2022-01-02 DIAGNOSIS — M79642 Pain in left hand: Secondary | ICD-10-CM | POA: Diagnosis not present

## 2022-01-07 DIAGNOSIS — M79642 Pain in left hand: Secondary | ICD-10-CM | POA: Diagnosis not present

## 2022-01-07 DIAGNOSIS — S52502S Unspecified fracture of the lower end of left radius, sequela: Secondary | ICD-10-CM | POA: Diagnosis not present

## 2022-01-14 DIAGNOSIS — M79642 Pain in left hand: Secondary | ICD-10-CM | POA: Diagnosis not present

## 2022-01-14 DIAGNOSIS — S52502S Unspecified fracture of the lower end of left radius, sequela: Secondary | ICD-10-CM | POA: Diagnosis not present

## 2022-01-21 DIAGNOSIS — M79642 Pain in left hand: Secondary | ICD-10-CM | POA: Diagnosis not present

## 2022-01-21 DIAGNOSIS — S52502S Unspecified fracture of the lower end of left radius, sequela: Secondary | ICD-10-CM | POA: Diagnosis not present

## 2022-03-12 DIAGNOSIS — G8929 Other chronic pain: Secondary | ICD-10-CM | POA: Diagnosis not present

## 2022-03-27 DIAGNOSIS — M8589 Other specified disorders of bone density and structure, multiple sites: Secondary | ICD-10-CM | POA: Diagnosis not present

## 2022-03-27 DIAGNOSIS — M810 Age-related osteoporosis without current pathological fracture: Secondary | ICD-10-CM | POA: Diagnosis not present

## 2023-02-05 DIAGNOSIS — M47819 Spondylosis without myelopathy or radiculopathy, site unspecified: Secondary | ICD-10-CM | POA: Diagnosis not present

## 2023-02-05 DIAGNOSIS — E785 Hyperlipidemia, unspecified: Secondary | ICD-10-CM | POA: Diagnosis not present

## 2023-02-05 DIAGNOSIS — E559 Vitamin D deficiency, unspecified: Secondary | ICD-10-CM | POA: Diagnosis not present

## 2023-02-05 DIAGNOSIS — G720 Drug-induced myopathy: Secondary | ICD-10-CM | POA: Diagnosis not present

## 2023-02-05 DIAGNOSIS — I129 Hypertensive chronic kidney disease with stage 1 through stage 4 chronic kidney disease, or unspecified chronic kidney disease: Secondary | ICD-10-CM | POA: Diagnosis not present

## 2023-02-05 DIAGNOSIS — R7301 Impaired fasting glucose: Secondary | ICD-10-CM | POA: Diagnosis not present

## 2023-02-05 DIAGNOSIS — E0590 Thyrotoxicosis, unspecified without thyrotoxic crisis or storm: Secondary | ICD-10-CM | POA: Diagnosis not present

## 2023-02-05 DIAGNOSIS — Z79899 Other long term (current) drug therapy: Secondary | ICD-10-CM | POA: Diagnosis not present

## 2023-02-05 DIAGNOSIS — T466X5A Adverse effect of antihyperlipidemic and antiarteriosclerotic drugs, initial encounter: Secondary | ICD-10-CM | POA: Diagnosis not present

## 2023-02-05 DIAGNOSIS — Z0000 Encounter for general adult medical examination without abnormal findings: Secondary | ICD-10-CM | POA: Diagnosis not present

## 2023-02-05 DIAGNOSIS — M4156 Other secondary scoliosis, lumbar region: Secondary | ICD-10-CM | POA: Diagnosis not present

## 2023-02-05 DIAGNOSIS — G894 Chronic pain syndrome: Secondary | ICD-10-CM | POA: Diagnosis not present

## 2023-05-23 DIAGNOSIS — L2081 Atopic neurodermatitis: Secondary | ICD-10-CM | POA: Diagnosis not present

## 2023-05-23 DIAGNOSIS — R21 Rash and other nonspecific skin eruption: Secondary | ICD-10-CM | POA: Diagnosis not present

## 2023-06-22 DIAGNOSIS — M25562 Pain in left knee: Secondary | ICD-10-CM | POA: Diagnosis not present

## 2023-07-03 DIAGNOSIS — S42291A Other displaced fracture of upper end of right humerus, initial encounter for closed fracture: Secondary | ICD-10-CM | POA: Diagnosis not present

## 2023-07-03 DIAGNOSIS — W010XXA Fall on same level from slipping, tripping and stumbling without subsequent striking against object, initial encounter: Secondary | ICD-10-CM | POA: Diagnosis not present

## 2023-07-03 DIAGNOSIS — W19XXXA Unspecified fall, initial encounter: Secondary | ICD-10-CM | POA: Diagnosis not present

## 2023-07-03 DIAGNOSIS — S42251A Displaced fracture of greater tuberosity of right humerus, initial encounter for closed fracture: Secondary | ICD-10-CM | POA: Diagnosis not present

## 2023-07-07 DIAGNOSIS — S42211A Unspecified displaced fracture of surgical neck of right humerus, initial encounter for closed fracture: Secondary | ICD-10-CM | POA: Diagnosis not present

## 2023-07-10 DIAGNOSIS — S42211A Unspecified displaced fracture of surgical neck of right humerus, initial encounter for closed fracture: Secondary | ICD-10-CM | POA: Diagnosis not present

## 2023-07-10 DIAGNOSIS — Z96611 Presence of right artificial shoulder joint: Secondary | ICD-10-CM | POA: Diagnosis not present

## 2023-08-11 DIAGNOSIS — S42211A Unspecified displaced fracture of surgical neck of right humerus, initial encounter for closed fracture: Secondary | ICD-10-CM | POA: Diagnosis not present

## 2023-09-09 DIAGNOSIS — S42211A Unspecified displaced fracture of surgical neck of right humerus, initial encounter for closed fracture: Secondary | ICD-10-CM | POA: Diagnosis not present

## 2023-09-09 DIAGNOSIS — Z96611 Presence of right artificial shoulder joint: Secondary | ICD-10-CM | POA: Diagnosis not present

## 2023-09-14 DIAGNOSIS — M1712 Unilateral primary osteoarthritis, left knee: Secondary | ICD-10-CM | POA: Diagnosis not present

## 2023-09-21 DIAGNOSIS — M1712 Unilateral primary osteoarthritis, left knee: Secondary | ICD-10-CM | POA: Diagnosis not present

## 2023-09-29 DIAGNOSIS — M1712 Unilateral primary osteoarthritis, left knee: Secondary | ICD-10-CM | POA: Diagnosis not present

## 2023-12-14 DIAGNOSIS — R21 Rash and other nonspecific skin eruption: Secondary | ICD-10-CM | POA: Diagnosis not present

## 2023-12-14 DIAGNOSIS — L509 Urticaria, unspecified: Secondary | ICD-10-CM | POA: Diagnosis not present

## 2024-02-12 DIAGNOSIS — Z9181 History of falling: Secondary | ICD-10-CM | POA: Diagnosis not present

## 2024-02-12 DIAGNOSIS — M4156 Other secondary scoliosis, lumbar region: Secondary | ICD-10-CM | POA: Diagnosis not present

## 2024-02-12 DIAGNOSIS — M47819 Spondylosis without myelopathy or radiculopathy, site unspecified: Secondary | ICD-10-CM | POA: Diagnosis not present

## 2024-02-12 DIAGNOSIS — Z79899 Other long term (current) drug therapy: Secondary | ICD-10-CM | POA: Diagnosis not present

## 2024-02-12 DIAGNOSIS — Z131 Encounter for screening for diabetes mellitus: Secondary | ICD-10-CM | POA: Diagnosis not present

## 2024-02-12 DIAGNOSIS — Z0000 Encounter for general adult medical examination without abnormal findings: Secondary | ICD-10-CM | POA: Diagnosis not present

## 2024-02-12 DIAGNOSIS — G894 Chronic pain syndrome: Secondary | ICD-10-CM | POA: Diagnosis not present

## 2024-02-12 DIAGNOSIS — N1831 Chronic kidney disease, stage 3a: Secondary | ICD-10-CM | POA: Diagnosis not present

## 2024-02-12 DIAGNOSIS — E785 Hyperlipidemia, unspecified: Secondary | ICD-10-CM | POA: Diagnosis not present

## 2024-02-12 DIAGNOSIS — E0590 Thyrotoxicosis, unspecified without thyrotoxic crisis or storm: Secondary | ICD-10-CM | POA: Diagnosis not present
